# Patient Record
Sex: Male | Born: 1940 | Race: Black or African American | Hispanic: No | Smoking: Former smoker
Health system: Southern US, Community
[De-identification: ages and names within clinical notes are randomized; demographics above are authoritative.]

## PROBLEM LIST (undated history)

## (undated) DIAGNOSIS — F0390 Unspecified dementia without behavioral disturbance: Secondary | ICD-10-CM

## (undated) DIAGNOSIS — I82409 Acute embolism and thrombosis of unspecified deep veins of unspecified lower extremity: Secondary | ICD-10-CM

## (undated) DIAGNOSIS — C801 Malignant (primary) neoplasm, unspecified: Secondary | ICD-10-CM

## (undated) DIAGNOSIS — I10 Essential (primary) hypertension: Secondary | ICD-10-CM

## (undated) HISTORY — PX: NO PAST SURGERIES: SHX2092

---

## 2013-08-18 ENCOUNTER — Encounter (HOSPITAL_COMMUNITY): Payer: Self-pay | Admitting: Emergency Medicine

## 2013-08-18 ENCOUNTER — Emergency Department (HOSPITAL_COMMUNITY)
Admission: EM | Admit: 2013-08-18 | Discharge: 2013-08-18 | Disposition: A | Discharge status: Home / Self Care | Payer: Medicare HMO | Attending: Emergency Medicine | Admitting: Emergency Medicine

## 2013-08-18 DIAGNOSIS — I10 Essential (primary) hypertension: Secondary | ICD-10-CM | POA: Insufficient documentation

## 2013-08-18 DIAGNOSIS — F0390 Unspecified dementia without behavioral disturbance: Secondary | ICD-10-CM | POA: Insufficient documentation

## 2013-08-18 DIAGNOSIS — Z8546 Personal history of malignant neoplasm of prostate: Secondary | ICD-10-CM | POA: Insufficient documentation

## 2013-08-18 DIAGNOSIS — K644 Residual hemorrhoidal skin tags: Secondary | ICD-10-CM | POA: Insufficient documentation

## 2013-08-18 DIAGNOSIS — Z87891 Personal history of nicotine dependence: Secondary | ICD-10-CM | POA: Insufficient documentation

## 2013-08-18 HISTORY — DX: Unspecified dementia, unspecified severity, without behavioral disturbance, psychotic disturbance, mood disturbance, and anxiety: F03.90

## 2013-08-18 HISTORY — DX: Malignant (primary) neoplasm, unspecified: C80.1

## 2013-08-18 HISTORY — DX: Essential (primary) hypertension: I10

## 2013-08-18 LAB — CBC
HCT: 39.8 % (ref 39.0–52.0)
Hemoglobin: 13.4 g/dL (ref 13.0–17.0)
MCH: 28.9 pg (ref 26.0–34.0)
MCHC: 33.7 g/dL (ref 30.0–36.0)
MCV: 86 fL (ref 78.0–100.0)
PLATELETS: 236 10*3/uL (ref 150–400)
RBC: 4.63 MIL/uL (ref 4.22–5.81)
RDW: 13.3 % (ref 11.5–15.5)
WBC: 6.7 10*3/uL (ref 4.0–10.5)

## 2013-08-18 LAB — TYPE AND SCREEN
ABO/RH(D): B POS
Antibody Screen: NEGATIVE

## 2013-08-18 LAB — COMPREHENSIVE METABOLIC PANEL
ALK PHOS: 125 U/L — AB (ref 39–117)
ALT: 20 U/L (ref 0–53)
AST: 30 U/L (ref 0–37)
Albumin: 4 g/dL (ref 3.5–5.2)
Anion gap: 12 (ref 5–15)
BILIRUBIN TOTAL: 0.4 mg/dL (ref 0.3–1.2)
BUN: 20 mg/dL (ref 6–23)
CHLORIDE: 99 meq/L (ref 96–112)
CO2: 26 meq/L (ref 19–32)
Calcium: 12.1 mg/dL — ABNORMAL HIGH (ref 8.4–10.5)
Creatinine, Ser: 1.01 mg/dL (ref 0.50–1.35)
GFR calc Af Amer: 85 mL/min — ABNORMAL LOW (ref >90)
GFR calc non Af Amer: 73 mL/min — ABNORMAL LOW (ref >90)
Glucose, Bld: 123 mg/dL — ABNORMAL HIGH (ref 70–99)
Potassium: 3.9 mEq/L (ref 3.7–5.3)
SODIUM: 137 meq/L (ref 137–147)
Total Protein: 7.6 g/dL (ref 6.0–8.3)

## 2013-08-18 LAB — ABO/RH: ABO/RH(D): B POS

## 2013-08-18 MED ORDER — HYDROCORTISONE ACE-PRAMOXINE 1-1 % RE FOAM: 1.0000 | Freq: Two times a day (BID) | RECTAL | Status: DC

## 2013-08-18 NOTE — Progress Notes (Signed)
  CARE MANAGEMENT ED NOTE 08/18/2013  Patient:  [name] [name],[name] [name]   Account Number:  [id number]  Date Initiated:  08/18/2013  Documentation initiated by:  Livia Snellen  Subjective/Objective Assessment:   Patient presents to ED with rectal pain rectal bleeding     Subjective/Objective Assessment Detail:     Action/Plan:   Action/Plan Detail:   Anticipated DC Date:  08/18/2013     Status Recommendation to Physician:   Result of Recommendation:    Other ED [place]  Other  PCP issues    Choice offered to / List presented to:            Status of service:  Completed, signed off  ED Comments:   ED Comments Detail:  EDCM spoke to patient at bedside.  Patient confirms his pcp is [name] [name] with [facility] and [name] [name] is the patient's urologist.  System updated.

## 2013-08-18 NOTE — ED Notes (Signed)
Pts son states pt woke up this morning w/ dark red blood in underwear, states when pt wipes he is having blood on toilet paper, pt also having rectal pain.

## 2013-08-18 NOTE — ED Provider Notes (Signed)
CSN: [id number]     Arrival date & time 08/18/13  1045 History   First MD Initiated Contact with Patient 08/18/13 1128     Chief Complaint  Patient presents with  . Rectal Bleeding     (Consider location/radiation/quality/duration/timing/severity/associated sxs/prior Treatment) Patient is a 73 y.o. male presenting with hematochezia. The history is provided by the patient and a relative.  Rectal Bleeding  patient here complaining of rectal bleeding that began this morning characterized as blood in his underwear. Patient also notes rectal pain and blood on his tissue when he wipes. Denies any flank primary blood per rectum. Abdominal pain. No fever or chills. No history of GI bleeding. Symptoms only occur when he is defecating. Denies any weakness or syncope or near-syncope. Does have a history of metastatic prostate cancer and was seen by his urologist today. Denies any urinary issue.  Past Medical History  Diagnosis Date  . Cancer     prostate  . Hypertension   . Dementia    History reviewed. No pertinent past surgical history. No family history on file. History  Substance Use Topics  . Smoking status: Former Research scientist (life sciences)  . Smokeless tobacco: Never Used  . Alcohol Use: No    Review of Systems  Gastrointestinal: Positive for hematochezia.  All other systems reviewed and are negative.     Allergies  Review of patient's allergies indicates not on file.  Home Medications   Prior to Admission medications   Not on File   BP 127/72  Pulse 89  Temp(Src) 97.9 F (36.6 C) (Oral)  Resp 16  Ht 5\' 9"  (1.753 m)  Wt 154 lb (69.854 kg)  BMI 22.73 kg/m2  SpO2 98% Physical Exam  Nursing note and vitals reviewed. Constitutional: He is oriented to person, place, and time. He appears well-developed and well-nourished.  Non-toxic appearance. No distress.  HENT:  Head: Normocephalic and atraumatic.  Eyes: Conjunctivae, EOM and lids are normal. Pupils are equal, round, and reactive to  light.  Neck: Normal range of motion. Neck supple. No tracheal deviation present. No mass present.  Cardiovascular: Normal rate, regular rhythm and normal heart sounds.  Exam reveals no gallop.   No murmur heard. Pulmonary/Chest: Effort normal and breath sounds normal. No stridor. No respiratory distress. He has no decreased breath sounds. He has no wheezes. He has no rhonchi. He has no rales.  Abdominal: Soft. Normal appearance and bowel sounds are normal. He exhibits no distension. There is no tenderness. There is no rigidity, no rebound, no guarding and no CVA tenderness.  Genitourinary: Rectal exam shows external hemorrhoid. Prostate is enlarged.  No bleeding from the hemorrhoids. Yellow stool noted  Musculoskeletal: Normal range of motion. He exhibits no edema and no tenderness.  Neurological: He is alert and oriented to person, place, and time. He has normal strength. No cranial nerve deficit or sensory deficit. GCS eye subscore is 4. GCS verbal subscore is 5. GCS motor subscore is 6.  Skin: Skin is warm and dry. No abrasion and no rash noted.  Psychiatric: He has a normal mood and affect. His speech is normal and behavior is normal.    ED Course  Procedures (including critical care time) Labs Review Labs Reviewed  CBC  COMPREHENSIVE METABOLIC PANEL  POC OCCULT BLOOD, ED  TYPE AND SCREEN    Imaging Review No results found.   EKG Interpretation None      MDM   Final diagnoses:  None    Patient here with no evidence  of acute GI bleeding. Suspect that his symptoms are from his hemorrhoids. Stable for discharge    Leota Jacobsen, MD 08/18/13 1339

## 2013-08-18 NOTE — Discharge Instructions (Signed)

## 2013-08-26 ENCOUNTER — Other Ambulatory Visit: Payer: Self-pay | Admitting: Gastroenterology

## 2013-08-26 DIAGNOSIS — R109 Unspecified abdominal pain: Secondary | ICD-10-CM

## 2013-08-30 ENCOUNTER — Emergency Department (HOSPITAL_COMMUNITY): Payer: Medicare HMO

## 2013-08-30 ENCOUNTER — Inpatient Hospital Stay (HOSPITAL_COMMUNITY)
Admission: EM | Admit: 2013-08-30 | Discharge: 2013-09-02 | DRG: 640 | Disposition: A | Discharge status: Skilled Nursing Facility | Payer: Medicare HMO | Attending: Internal Medicine | Admitting: Internal Medicine

## 2013-08-30 ENCOUNTER — Encounter (HOSPITAL_COMMUNITY): Payer: Self-pay | Admitting: Emergency Medicine

## 2013-08-30 DIAGNOSIS — E872 Acidosis, unspecified: Secondary | ICD-10-CM | POA: Diagnosis present

## 2013-08-30 DIAGNOSIS — F0391 Unspecified dementia with behavioral disturbance: Secondary | ICD-10-CM

## 2013-08-30 DIAGNOSIS — C61 Malignant neoplasm of prostate: Secondary | ICD-10-CM

## 2013-08-30 DIAGNOSIS — I498 Other specified cardiac arrhythmias: Secondary | ICD-10-CM | POA: Diagnosis present

## 2013-08-30 DIAGNOSIS — R1013 Epigastric pain: Secondary | ICD-10-CM

## 2013-08-30 DIAGNOSIS — I82409 Acute embolism and thrombosis of unspecified deep veins of unspecified lower extremity: Secondary | ICD-10-CM

## 2013-08-30 DIAGNOSIS — D638 Anemia in other chronic diseases classified elsewhere: Secondary | ICD-10-CM | POA: Diagnosis present

## 2013-08-30 DIAGNOSIS — Z86711 Personal history of pulmonary embolism: Secondary | ICD-10-CM

## 2013-08-30 DIAGNOSIS — Z87891 Personal history of nicotine dependence: Secondary | ICD-10-CM

## 2013-08-30 DIAGNOSIS — Z23 Encounter for immunization: Secondary | ICD-10-CM | POA: Diagnosis not present

## 2013-08-30 DIAGNOSIS — C7951 Secondary malignant neoplasm of bone: Secondary | ICD-10-CM | POA: Diagnosis present

## 2013-08-30 DIAGNOSIS — Z7901 Long term (current) use of anticoagulants: Secondary | ICD-10-CM

## 2013-08-30 DIAGNOSIS — I10 Essential (primary) hypertension: Secondary | ICD-10-CM | POA: Diagnosis present

## 2013-08-30 DIAGNOSIS — C801 Malignant (primary) neoplasm, unspecified: Secondary | ICD-10-CM

## 2013-08-30 DIAGNOSIS — E876 Hypokalemia: Secondary | ICD-10-CM | POA: Diagnosis present

## 2013-08-30 DIAGNOSIS — F0390 Unspecified dementia without behavioral disturbance: Secondary | ICD-10-CM | POA: Diagnosis present

## 2013-08-30 DIAGNOSIS — D72829 Elevated white blood cell count, unspecified: Secondary | ICD-10-CM | POA: Diagnosis present

## 2013-08-30 DIAGNOSIS — G9340 Encephalopathy, unspecified: Secondary | ICD-10-CM

## 2013-08-30 DIAGNOSIS — K5900 Constipation, unspecified: Secondary | ICD-10-CM

## 2013-08-30 DIAGNOSIS — E860 Dehydration: Secondary | ICD-10-CM | POA: Diagnosis present

## 2013-08-30 DIAGNOSIS — Z86718 Personal history of other venous thrombosis and embolism: Secondary | ICD-10-CM | POA: Diagnosis not present

## 2013-08-30 DIAGNOSIS — E43 Unspecified severe protein-calorie malnutrition: Secondary | ICD-10-CM | POA: Diagnosis present

## 2013-08-30 DIAGNOSIS — R5383 Other fatigue: Secondary | ICD-10-CM | POA: Diagnosis not present

## 2013-08-30 DIAGNOSIS — C7952 Secondary malignant neoplasm of bone marrow: Secondary | ICD-10-CM

## 2013-08-30 DIAGNOSIS — R5381 Other malaise: Secondary | ICD-10-CM | POA: Diagnosis present

## 2013-08-30 DIAGNOSIS — Z66 Do not resuscitate: Secondary | ICD-10-CM | POA: Diagnosis present

## 2013-08-30 HISTORY — DX: Acute embolism and thrombosis of unspecified deep veins of unspecified lower extremity: I82.409

## 2013-08-30 LAB — CBC
HCT: 39 % (ref 39.0–52.0)
HEMOGLOBIN: 13.4 g/dL (ref 13.0–17.0)
MCH: 29.6 pg (ref 26.0–34.0)
MCHC: 34.4 g/dL (ref 30.0–36.0)
MCV: 86.3 fL (ref 78.0–100.0)
Platelets: 265 10*3/uL (ref 150–400)
RBC: 4.52 MIL/uL (ref 4.22–5.81)
RDW: 13.2 % (ref 11.5–15.5)
WBC: 16.7 10*3/uL — AB (ref 4.0–10.5)

## 2013-08-30 LAB — BASIC METABOLIC PANEL
Anion gap: 19 — ABNORMAL HIGH (ref 5–15)
BUN: 26 mg/dL — ABNORMAL HIGH (ref 6–23)
CHLORIDE: 95 meq/L — AB (ref 96–112)
CO2: 20 mEq/L (ref 19–32)
Calcium: >15 mg/dL (ref 8.4–10.5)
Creatinine, Ser: 1.19 mg/dL (ref 0.50–1.35)
GFR calc Af Amer: 70 mL/min — ABNORMAL LOW (ref >90)
GFR, EST NON AFRICAN AMERICAN: 60 mL/min — AB (ref >90)
GLUCOSE: 145 mg/dL — AB (ref 70–99)
Potassium: 3.9 mEq/L (ref 3.7–5.3)
SODIUM: 134 meq/L — AB (ref 137–147)

## 2013-08-30 LAB — URINALYSIS, ROUTINE W REFLEX MICROSCOPIC
Bilirubin Urine: NEGATIVE
Glucose, UA: NEGATIVE mg/dL
KETONES UR: NEGATIVE mg/dL
LEUKOCYTES UA: NEGATIVE
Nitrite: NEGATIVE
PROTEIN: NEGATIVE mg/dL
Specific Gravity, Urine: 1.024 (ref 1.005–1.030)
Urobilinogen, UA: 0.2 mg/dL (ref 0.0–1.0)
pH: 5 (ref 5.0–8.0)

## 2013-08-30 LAB — URINE MICROSCOPIC-ADD ON

## 2013-08-30 LAB — LIPASE, BLOOD: Lipase: 15 U/L (ref 11–59)

## 2013-08-30 LAB — HEPATIC FUNCTION PANEL
ALBUMIN: 3.6 g/dL (ref 3.5–5.2)
ALT: 71 U/L — ABNORMAL HIGH (ref 0–53)
AST: 53 U/L — ABNORMAL HIGH (ref 0–37)
Alkaline Phosphatase: 195 U/L — ABNORMAL HIGH (ref 39–117)
BILIRUBIN TOTAL: 0.6 mg/dL (ref 0.3–1.2)
Total Protein: 8 g/dL (ref 6.0–8.3)

## 2013-08-30 LAB — I-STAT CG4 LACTIC ACID, ED: Lactic Acid, Venous: 4.18 mmol/L — ABNORMAL HIGH (ref 0.5–2.2)

## 2013-08-30 LAB — LACTIC ACID, PLASMA: LACTIC ACID, VENOUS: 2.3 mmol/L — AB (ref 0.5–2.2)

## 2013-08-30 LAB — CBG MONITORING, ED: GLUCOSE-CAPILLARY: 132 mg/dL — AB (ref 70–99)

## 2013-08-30 MED ORDER — SODIUM CHLORIDE 0.9 % IV BOLUS (SEPSIS)
1000.0000 mL | Freq: Once | INTRAVENOUS | Status: AC
  Administered 2013-08-30: 1000 mL via INTRAVENOUS

## 2013-08-30 MED ORDER — SODIUM CHLORIDE 0.9 % IV SOLN: INTRAVENOUS | Status: AC

## 2013-08-30 MED ORDER — ONDANSETRON HCL 4 MG PO TABS: 4.0000 mg | ORAL_TABLET | Freq: Four times a day (QID) | ORAL | Status: DC | PRN

## 2013-08-30 MED ORDER — ACETAMINOPHEN 325 MG PO TABS: 650.0000 mg | ORAL_TABLET | Freq: Four times a day (QID) | ORAL | Status: DC | PRN

## 2013-08-30 MED ORDER — TAMSULOSIN HCL 0.4 MG PO CAPS
0.4000 mg | ORAL_CAPSULE | Freq: Every evening | ORAL | Status: DC
  Administered 2013-08-31 – 2013-09-02 (×4): 0.4 mg via ORAL
  Filled 2013-08-30 (×5): qty 1

## 2013-08-30 MED ORDER — CALCITONIN (SALMON) 200 UNIT/ML IJ SOLN
4.0000 [IU]/kg | Freq: Once | INTRAMUSCULAR | Status: AC
  Administered 2013-08-31: 264 [IU] via SUBCUTANEOUS
  Filled 2013-08-30: qty 1.32

## 2013-08-30 MED ORDER — SODIUM CHLORIDE 0.9 % IJ SOLN
3.0000 mL | Freq: Two times a day (BID) | INTRAMUSCULAR | Status: DC
  Administered 2013-09-01 (×2): 3 mL via INTRAVENOUS

## 2013-08-30 MED ORDER — ONDANSETRON HCL 4 MG/2ML IJ SOLN: 4.0000 mg | Freq: Four times a day (QID) | INTRAMUSCULAR | Status: DC | PRN

## 2013-08-30 MED ORDER — SODIUM CHLORIDE 0.9 % IV SOLN
INTRAVENOUS | Status: DC
  Administered 2013-08-30 – 2013-09-01 (×8): via INTRAVENOUS

## 2013-08-30 MED ORDER — ACETAMINOPHEN 650 MG RE SUPP: 650.0000 mg | Freq: Four times a day (QID) | RECTAL | Status: DC | PRN

## 2013-08-30 MED ORDER — ATORVASTATIN CALCIUM 10 MG PO TABS
10.0000 mg | ORAL_TABLET | Freq: Every day | ORAL | Status: DC
  Administered 2013-08-31 – 2013-09-02 (×3): 10 mg via ORAL
  Filled 2013-08-30 (×3): qty 1

## 2013-08-30 NOTE — ED Notes (Addendum)
Dr. Regenia Skeeter aware of critical Ca.

## 2013-08-30 NOTE — ED Notes (Signed)
Initial Contact - pt from triage to [facility]25 with family, son reports recent decline.  Pt with recently dx prostate CA, not on chemo yet, with no appetite, weakness and reported stomach pain which has been improving.  Pt denies pain at this time.  Pt appears weak and unwell.  Skin PWD.  MAEI.  +csm/+pulses.  Neuros grossly intact.  Pt denies cp/palpitations or SOB.  Abd s/nt/nd.  Pt denies n/v.  NAD.

## 2013-08-30 NOTE — ED Notes (Signed)
Pt ret from radiology,

## 2013-08-30 NOTE — ED Notes (Signed)
Gave I Stat CG4 result to MD Regenia Skeeter

## 2013-08-30 NOTE — ED Notes (Signed)
CBG is 132. °

## 2013-08-30 NOTE — ED Notes (Addendum)
Son states that the pt has metastatic prostate cancer. Pt c/o stomach pain, generalized weakness, lethargy, and decrease in activity that is increasingly getting worse each day. Son states that the stomach pain has been getting better. Son also states that pt may have some early signs of dementia and they have a neurologist appointment this week. Pt was released last Wednesday from [facility] due to rectal bleeding from hemorrhoids. Son also states that he has not had an appetite for the past two weeks. Son has been giving him Ensure.

## 2013-08-30 NOTE — ED Provider Notes (Signed)
CSN: [id number]     Arrival date & time 08/30/13  1831 History   First MD Initiated Contact with Patient 08/30/13 1858     Chief Complaint  Patient presents with  . Abdominal Pain  . Weakness     (Consider location/radiation/quality/duration/timing/severity/associated sxs/prior Treatment) HPI 73 year old male presents with weakness over the last week. The history is mostly taken from the son as the patient states he is feeling fine. Some endorses the patient has metastatic prostate cancer and is currently getting hormone therapy from his urologist, Dr. Risa Grill. The last injection was 3 weeks ago. After this he is to be getting chemotherapy the patient has been having progressive weakness and weight loss. Last week he is here for rectal bleeding that was attributed to his hemorrhoids. Not had any bleeding in his stool since then. No vomiting or diarrhea. He evidently is getting abdominal pain each day and place his epigastrium. His not currently having no symptoms now. No bloating. He's currently on calcium replacements for his "bones". Son endorse the patient is also been somewhat confused and has been having quieter/slurred speech over the last 2 days.  Past Medical History  Diagnosis Date  . Hypertension   . Dementia   . Cancer     prostate   History reviewed. No pertinent past surgical history. History reviewed. No pertinent family history. History  Substance Use Topics  . Smoking status: Former Research scientist (life sciences)  . Smokeless tobacco: Never Used  . Alcohol Use: No    Review of Systems  Constitutional: Positive for fatigue. Negative for fever.  Respiratory: Negative for shortness of breath.   Gastrointestinal: Positive for abdominal pain. Negative for vomiting, diarrhea, blood in stool and abdominal distention.  Musculoskeletal: Negative for back pain.  Neurological: Positive for speech difficulty and weakness. Negative for headaches.  Psychiatric/Behavioral: Positive for confusion.  All  other systems reviewed and are negative.     Allergies  Ace inhibitors and Namenda  Home Medications   Prior to Admission medications   Medication Sig Start Date End Date Taking? Authorizing Provider  apixaban (ELIQUIS) 5 MG TABS tablet Take 5 mg by mouth 2 (two) times daily.    Historical Provider, MD  atorvastatin (LIPITOR) 10 MG tablet Take 10 mg by mouth daily with breakfast.    Historical Provider, MD  bicalutamide (CASODEX) 50 MG tablet Take 50 mg by mouth daily with breakfast.    Historical Provider, MD  calcium gluconate 500 MG tablet Take 1 tablet by mouth daily with breakfast.    Historical Provider, MD  Cholecalciferol (VITAMIN D) 400 UNITS capsule Take 400 Units by mouth daily with breakfast.    Historical Provider, MD  ESTROGENS CONJUGATED IJ Inject as directed every 30 (thirty) days. Gets injections for prostate cancer at [facility]    Historical Provider, MD  fexofenadine (ALLEGRA) 180 MG tablet Take 180 mg by mouth daily with breakfast.    Historical Provider, MD  hydrocortisone-pramoxine (PROCTOFOAM HC) rectal foam Place 1 applicator rectally 2 (two) times daily. 08/18/13   Leota Jacobsen, MD  potassium chloride (K-DUR) 10 MEQ tablet Take 10 mEq by mouth daily with breakfast.    Historical Provider, MD  tamsulosin (FLOMAX) 0.4 MG CAPS capsule Take 0.4 mg by mouth every evening.    Historical Provider, MD   BP 122/78  Pulse 125  Temp(Src) 99.1 F (37.3 C) (Oral)  Resp 20  SpO2 97% Physical Exam  Nursing note and vitals reviewed. Constitutional: He is oriented to person, place, and  time. He appears well-developed and well-nourished.  HENT:  Head: Normocephalic and atraumatic.  Right Ear: External ear normal.  Left Ear: External ear normal.  Nose: Nose normal.  Eyes: Right eye exhibits no discharge. Left eye exhibits no discharge.  Neck: Neck supple.  Cardiovascular: Normal rate, regular rhythm, normal heart sounds and intact distal pulses.    Pulmonary/Chest: Effort normal. No respiratory distress.  Abdominal: Soft. He exhibits no distension. There is no tenderness.  Musculoskeletal: He exhibits no edema.  Neurological: He is alert and oriented to person, place, and time.  CN 2-12 grossly intact. 5/5 strength in upper extremities. 4/5 strength in lower extremities.   Skin: Skin is warm and dry.    ED Course  Procedures (including critical care time) Labs Review Labs Reviewed  CBC - Abnormal; Notable for the following:    WBC 16.7 (*)    All other components within normal limits  BASIC METABOLIC PANEL - Abnormal; Notable for the following:    Sodium 134 (*)    Chloride 95 (*)    Glucose, Bld 145 (*)    BUN 26 (*)    Calcium >15.0 (*)    GFR calc non Af Amer 60 (*)    GFR calc Af Amer 70 (*)    Anion gap 19 (*)    All other components within normal limits  URINALYSIS, ROUTINE W REFLEX MICROSCOPIC - Abnormal; Notable for the following:    Hgb urine dipstick SMALL (*)    All other components within normal limits  HEPATIC FUNCTION PANEL - Abnormal; Notable for the following:    AST 53 (*)    ALT 71 (*)    Alkaline Phosphatase 195 (*)    All other components within normal limits  CBG MONITORING, ED - Abnormal; Notable for the following:    Glucose-Capillary 132 (*)    All other components within normal limits  I-STAT CG4 LACTIC ACID, ED - Abnormal; Notable for the following:    Lactic Acid, Venous 4.18 (*)    All other components within normal limits  LIPASE, BLOOD  URINE MICROSCOPIC-ADD ON  COMPREHENSIVE METABOLIC PANEL  LACTIC ACID, PLASMA    Imaging Review Dg Chest 2 View  08/30/2013   CLINICAL DATA:  Prostate cancer.  Weakness.  EXAM: CHEST  2 VIEW  COMPARISON:  None.  FINDINGS: The lungs are clear. Heart size is normal. There is no pneumothorax or effusion. No focal bony abnormality is identified.  IMPRESSION: No acute disease.   Electronically Signed   By: Inge Rise M.D.   On: 08/30/2013 20:26    Ct Head Wo Contrast  08/30/2013   CLINICAL DATA:  Confusion, slurred speech  EXAM: CT HEAD WITHOUT CONTRAST  TECHNIQUE: Contiguous axial images were obtained from the base of the skull through the vertex without intravenous contrast.  COMPARISON:  None.  FINDINGS: No skull fracture is noted. Paranasal sinuses and mastoid air cells are unremarkable.  No intracranial hemorrhage, mass effect or midline shift. Moderate cerebral atrophy. No acute cortical infarction. No mass lesion is noted on this unenhanced scan.  IMPRESSION: No acute intracranial abnormality. Moderate cerebral atrophy. No definite acute cortical infarction.   Electronically Signed   By: Lahoma Crocker M.D.   On: 08/30/2013 20:32     EKG Interpretation None      MDM   Final diagnoses:  Hypercalcemia of malignancy  Malignant neoplasm of prostate  Unspecified constipation  Abdominal pain, epigastric  Lactic acid acidosis  Dehydration  Acute encephalopathy  Patient has no abdominal pain while in the ED. He is tachycardic has evidence of lactic acidosis. This is more likely from dehydration given his weight loss and poor by mouth intake. No evidence of anemia causing his fatigue. CT scan is benign, though does have acute confusion. There is no evidence of an infectious cause. At this time we'll continue fluids for both his lactic acidosis and hypercalcemia. Will admit to medicine.    Ephraim Hamburger, MD 08/30/13 2350

## 2013-08-30 NOTE — ED Notes (Signed)
Pt aware of need for urine specimen, sts unable to provide at this time.   

## 2013-08-30 NOTE — H&P (Signed)
[facility] History and Physical  Patient: [name] [name]  YKZ:[id number]  DOB: 1940/02/09  DOS: the patient was seen and examined on 08/30/2013 PCP: Kandice Hams, MD  Chief Complaint: Confusion and lethargy with abdominal pain  HPI: General Wearing is a 73 y.o. 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 Patient presented with confusion and lethargy. Patient is basically from [place] where he was diagnosed with DVT in 2014 and later on in November 2014 was diagnosed with metastatic prostate cancer. He was started on hormonal therapy there and then later on was brought here by the family so that they can help him managing his medication and health. Patient establish here with Dr. Risa Grill urology and started with hormonal treatment. He also establish care with the PCP. He was started on Casodex, calcium supplements, vitamin D supplements. His antihypertensive medications hydralazine and amlodipine were stopped due to persistently low blood pressure. Since last to 3 weeks he has been having complaints of epigastric pain associated with nausea and occasional vomiting. Since one of the side effect of Casodex was epigastric pain 5 days ago son stopped patient's Casodex and was awaiting urology call back. Since last 3 days the patient has been having generalized weakness, increased urination, fatigue, progressively worsening confusion. He also had a mechanical fall. Abdominal pain that the patient was initially complaining improved and later on came back. Patient was also found to have constipation.  The patient is coming from home. And at his baseline independent for most of his ADL.  Review of Systems: as mentioned in the history of present illness.  A Comprehensive review of the other systems is negative.  Past Medical History  Diagnosis Date  . Hypertension   .  Dementia   . Cancer     prostate, spine ribs, left pelvis, and skull  . DVT (deep venous thrombosis)    History reviewed. No pertinent past surgical history. Social History:  reports that he has quit smoking. He has never used smokeless tobacco. He reports that he does not drink alcohol or use illicit drugs.  Allergies  Allergen Reactions  . Ace Inhibitors Cough  . Namenda [Memantine Hcl] Other (See Comments)    Confusion     History reviewed. No pertinent family history.  Prior to Admission medications   Medication Sig Start Date End Date Taking? Authorizing Provider  apixaban (ELIQUIS) 5 MG TABS tablet Take 5 mg by mouth 2 (two) times daily.   Yes Historical Provider, MD  atorvastatin (LIPITOR) 10 MG tablet Take 10 mg by mouth daily with breakfast.   Yes Historical Provider, MD  calcium gluconate 500 MG tablet Take 1 tablet by mouth daily with breakfast.   Yes Historical Provider, MD  Cholecalciferol (VITAMIN D) 400 UNITS capsule Take 400 Units by mouth daily with breakfast.   Yes Historical Provider, MD  feeding supplement (ENSURE IMMUNE HEALTH) LIQD Take 237 mLs by mouth 3 (three) times daily with meals.   Yes Historical Provider, MD  fexofenadine (ALLEGRA) 180 MG tablet Take 180 mg by mouth daily with breakfast.   Yes Historical Provider, MD  potassium chloride (K-DUR) 10 MEQ tablet Take 10 mEq by mouth daily with breakfast.   Yes Historical Provider, MD  tamsulosin (FLOMAX) 0.4 MG CAPS capsule Take 0.4 mg by mouth every evening.   Yes Historical Provider, MD  bicalutamide (CASODEX) 50 MG tablet Take 50 mg by mouth daily.  Historical Provider, MD  ESTROGENS CONJUGATED IJ Inject as directed every 30 (thirty) days. Gets injections for prostate cancer at [facility]    Historical Provider, MD    Physical Exam: Filed Vitals:   08/30/13 2130 08/30/13 2200 08/30/13 2230 08/30/13 2258  BP: 127/77 141/84 144/92 159/85  Pulse:    108  Temp:    98.6 F (37 C)  TempSrc:    Oral   Resp: 20 31 19 20   Height:    5\' 8"  (1.727 m)  Weight:    65.772 kg (145 lb)  SpO2:    97%    General: Alert, Awake and Oriented to Time, Place and Person. Appear in mild distress Eyes: PERRL ENT: Oral Mucosa clear  dry . Neck: no JVD Cardiovascular: S1 and S2 Present, no Murmur, Peripheral Pulses Present Respiratory: Bilateral Air entry equal and Decreased, Clear to Auscultation, noCrackles, no wheezes Abdomen: Bowel Sound Present, Soft and Non tender Skin: no Rash Extremities: Bilateral trace  Pedal edema, no calf tenderness Neurologic: Grossly no focal neuro deficit.  Labs on Admission:  CBC:  Recent Labs Lab 08/30/13 1907  WBC 16.7*  HGB 13.4  HCT 39.0  MCV 86.3  PLT 265    CMP     Component Value Date/Time   NA 134* 08/30/2013 1907   K 3.9 08/30/2013 1907   CL 95* 08/30/2013 1907   CO2 20 08/30/2013 1907   GLUCOSE 145* 08/30/2013 1907   BUN 26* 08/30/2013 1907   CREATININE 1.19 08/30/2013 1907   CALCIUM >15.0* 08/30/2013 1907   PROT 8.0 08/30/2013 1907   ALBUMIN 3.6 08/30/2013 1907   AST 53* 08/30/2013 1907   ALT 71* 08/30/2013 1907   ALKPHOS 195* 08/30/2013 1907   BILITOT 0.6 08/30/2013 1907   GFRNONAA 60* 08/30/2013 1907   GFRAA 70* 08/30/2013 1907     Recent Labs Lab 08/30/13 1907  LIPASE 15   No results found for this basename: AMMONIA,  in the last 168 hours  No results found for this basename: CKTOTAL, CKMB, CKMBINDEX, TROPONINI,  in the last 168 hours BNP (last 3 results) No results found for this basename: PROBNP,  in the last 8760 hours  Radiological Exams on Admission: Dg Chest 2 View  08/30/2013   CLINICAL DATA:  Prostate cancer.  Weakness.  EXAM: CHEST  2 VIEW  COMPARISON:  None.  FINDINGS: The lungs are clear. Heart size is normal. There is no pneumothorax or effusion. No focal bony abnormality is identified.  IMPRESSION: No acute disease.   Electronically Signed   By: Inge Rise M.D.   On: 08/30/2013 20:26   Ct Head Wo  Contrast  08/30/2013   CLINICAL DATA:  Confusion, slurred speech  EXAM: CT HEAD WITHOUT CONTRAST  TECHNIQUE: Contiguous axial images were obtained from the base of the skull through the vertex without intravenous contrast.  COMPARISON:  None.  FINDINGS: No skull fracture is noted. Paranasal sinuses and mastoid air cells are unremarkable.  No intracranial hemorrhage, mass effect or midline shift. Moderate cerebral atrophy. No acute cortical infarction. No mass lesion is noted on this unenhanced scan.  IMPRESSION: No acute intracranial abnormality. Moderate cerebral atrophy. No definite acute cortical infarction.   Electronically Signed   By: Lahoma Crocker M.D.   On: 08/30/2013 20:32    EKG: Independently reviewed. nonspecific ST and T waves changes, sinus tachycardia. Assessment/Plan Principal Problem:   Hypercalcemia of malignancy Active Problems:   Hypertension   Dementia   Cancer  DVT (deep venous thrombosis)   1. Hypercalcemia of malignancy  patient is presenting with complaints of confusion, constipation, epigastric pain, appears dehydrated on exam, increasing urination and generalized weakness. He is found to have calcium level of more than 15. He has history of prostatic cancer with bony metastasis, he was also on Casodex, he was also taking calcium supplements and vitamin D as his potential etiology for his hypercalcemia his combination of his malignancy as well as oral intake. Currently his energy level has been trending downward with IV fluids only. I will give him one dose of calcitonin and monitor his suppression again in the morning. Continue with aggressive IV hydration at a rate of 150 cc per hour. Her ins and outs. Daily weight. The patient is on responding to calcitonin that he may require calcitonin as well as bisphosphonates.  2.hypertension. At present his blood pressure is stable continue to monitor.  3. DVT. Continue Apixaban. CT of the head is negative for any acute  hemorrhage.  4. prostatic cancer. At present holding his treatment. Continue monitoring.   DVT Prophylaxis: on chronic anticoagulation Nutrition:  regular diet   Code Status:  DNR/DNI   Family Communication:  son was present at bedside, opportunity was given to ask question and all questions were answered satisfactorily at the time of interview. Disposition: Admitted to inpatient in med-surge unit.  Author: Berle Mull, MD [facility] Pager: [phone] 08/30/2013, 11:20 PM    If 7PM-7AM, please contact night-coverage [email or web address] Password [email or web address]  **Disclaimer: This note may have been dictated with voice recognition software. Similar sounding words can inadvertently be transcribed and this note may contain transcription errors which may not have been corrected upon publication of note.**

## 2013-08-31 ENCOUNTER — Telehealth: Payer: Self-pay | Admitting: Neurology

## 2013-08-31 ENCOUNTER — Ambulatory Visit: Payer: Medicare HMO | Admitting: Neurology

## 2013-08-31 DIAGNOSIS — F0390 Unspecified dementia without behavioral disturbance: Secondary | ICD-10-CM | POA: Diagnosis present

## 2013-08-31 DIAGNOSIS — R1013 Epigastric pain: Secondary | ICD-10-CM

## 2013-08-31 DIAGNOSIS — E860 Dehydration: Secondary | ICD-10-CM

## 2013-08-31 DIAGNOSIS — G9340 Encephalopathy, unspecified: Secondary | ICD-10-CM

## 2013-08-31 DIAGNOSIS — I10 Essential (primary) hypertension: Secondary | ICD-10-CM | POA: Diagnosis present

## 2013-08-31 DIAGNOSIS — I82409 Acute embolism and thrombosis of unspecified deep veins of unspecified lower extremity: Secondary | ICD-10-CM | POA: Diagnosis present

## 2013-08-31 DIAGNOSIS — C801 Malignant (primary) neoplasm, unspecified: Secondary | ICD-10-CM | POA: Diagnosis present

## 2013-08-31 LAB — COMPREHENSIVE METABOLIC PANEL
ALBUMIN: 3.1 g/dL — AB (ref 3.5–5.2)
ALK PHOS: 167 U/L — AB (ref 39–117)
ALT: 56 U/L — ABNORMAL HIGH (ref 0–53)
ALT: 58 U/L — ABNORMAL HIGH (ref 0–53)
ANION GAP: 14 (ref 5–15)
AST: 44 U/L — ABNORMAL HIGH (ref 0–37)
AST: 45 U/L — ABNORMAL HIGH (ref 0–37)
Albumin: 3.2 g/dL — ABNORMAL LOW (ref 3.5–5.2)
Alkaline Phosphatase: 165 U/L — ABNORMAL HIGH (ref 39–117)
Anion gap: 16 — ABNORMAL HIGH (ref 5–15)
BILIRUBIN TOTAL: 0.7 mg/dL (ref 0.3–1.2)
BUN: 23 mg/dL (ref 6–23)
BUN: 19 mg/dL (ref 6–23)
CALCIUM: 14 mg/dL — AB (ref 8.4–10.5)
CO2: 22 mEq/L (ref 19–32)
CO2: 22 meq/L (ref 19–32)
Calcium: 12.8 mg/dL — ABNORMAL HIGH (ref 8.4–10.5)
Chloride: 101 mEq/L (ref 96–112)
Chloride: 100 mEq/L (ref 96–112)
Creatinine, Ser: 1.05 mg/dL (ref 0.50–1.35)
Creatinine, Ser: 0.96 mg/dL (ref 0.50–1.35)
GFR calc Af Amer: 81 mL/min — ABNORMAL LOW (ref >90)
GFR calc Af Amer: >90 mL/min (ref >90)
GFR calc non Af Amer: 70 mL/min — ABNORMAL LOW (ref >90)
GFR calc non Af Amer: 82 mL/min — ABNORMAL LOW (ref >90)
GLUCOSE: 120 mg/dL — AB (ref 70–99)
Glucose, Bld: 118 mg/dL — ABNORMAL HIGH (ref 70–99)
POTASSIUM: 3.6 meq/L — AB (ref 3.7–5.3)
Potassium: 3.7 mEq/L (ref 3.7–5.3)
Sodium: 137 mEq/L (ref 137–147)
Sodium: 138 mEq/L (ref 137–147)
TOTAL PROTEIN: 6.8 g/dL (ref 6.0–8.3)
Total Bilirubin: 0.7 mg/dL (ref 0.3–1.2)
Total Protein: 6.8 g/dL (ref 6.0–8.3)

## 2013-08-31 LAB — CBC WITH DIFFERENTIAL/PLATELET
Basophils Absolute: 0 10*3/uL (ref 0.0–0.1)
Basophils Relative: 0 % (ref 0–1)
Eosinophils Absolute: 0 10*3/uL (ref 0.0–0.7)
Eosinophils Relative: 0 % (ref 0–5)
HCT: 34.1 % — ABNORMAL LOW (ref 39.0–52.0)
HEMOGLOBIN: 11.4 g/dL — AB (ref 13.0–17.0)
LYMPHS ABS: 1.2 10*3/uL (ref 0.7–4.0)
LYMPHS PCT: 9 % — AB (ref 12–46)
MCH: 28.9 pg (ref 26.0–34.0)
MCHC: 33.4 g/dL (ref 30.0–36.0)
MCV: 86.3 fL (ref 78.0–100.0)
MONOS PCT: 14 % — AB (ref 3–12)
Monocytes Absolute: 2.1 10*3/uL — ABNORMAL HIGH (ref 0.1–1.0)
NEUTROS ABS: 11.1 10*3/uL — AB (ref 1.7–7.7)
NEUTROS PCT: 77 % (ref 43–77)
Platelets: 210 10*3/uL (ref 150–400)
RBC: 3.95 MIL/uL — AB (ref 4.22–5.81)
RDW: 13.1 % (ref 11.5–15.5)
WBC: 14.5 10*3/uL — AB (ref 4.0–10.5)

## 2013-08-31 MED ORDER — HALOPERIDOL LACTATE 5 MG/ML IJ SOLN
1.0000 mg | Freq: Four times a day (QID) | INTRAMUSCULAR | Status: DC | PRN
  Administered 2013-08-31: 1 mg via INTRAVENOUS
  Filled 2013-08-31: qty 1

## 2013-08-31 MED ORDER — PNEUMOCOCCAL VAC POLYVALENT 25 MCG/0.5ML IJ INJ
0.5000 mL | INJECTION | INTRAMUSCULAR | Status: AC
  Administered 2013-09-01: 0.5 mL via INTRAMUSCULAR
  Filled 2013-08-31 (×2): qty 0.5

## 2013-08-31 MED ORDER — ENSURE COMPLETE PO LIQD
237.0000 mL | ORAL | Status: DC
  Administered 2013-08-31 – 2013-09-02 (×3): 237 mL via ORAL

## 2013-08-31 MED ORDER — ALPRAZOLAM 0.25 MG PO TABS
0.2500 mg | ORAL_TABLET | Freq: Two times a day (BID) | ORAL | Status: DC | PRN
  Administered 2013-08-31: 0.25 mg via ORAL
  Filled 2013-08-31: qty 1

## 2013-08-31 MED ORDER — APIXABAN 5 MG PO TABS
5.0000 mg | ORAL_TABLET | Freq: Two times a day (BID) | ORAL | Status: DC
Start: 2013-08-31 — End: 2013-09-02
  Administered 2013-08-31 – 2013-09-02 (×5): 5 mg via ORAL
  Filled 2013-08-31 (×7): qty 1

## 2013-08-31 NOTE — Progress Notes (Signed)
ANTICOAGULATION CONSULT NOTE - Initial Consult  Pharmacy Consult for Apixaban Indication: DVT  Allergies  Allergen Reactions  . Ace Inhibitors Cough  . Namenda [Memantine Hcl] Other (See Comments)    Confusion     Patient Measurements: Height: 5\' 8"  (172.7 cm) Weight: 145 lb (65.772 kg) IBW/kg (Calculated) : 68.4 Heparin Dosing Weight:   Vital Signs: Temp: 98.6 F (37 C) (07/27 2258) Temp src: Oral (07/27 2258) BP: 159/85 mmHg (07/27 2258) Pulse Rate: 108 (07/27 2258)  Labs:  Recent Labs  08/30/13 1907 08/30/13 2325  HGB 13.4  --   HCT 39.0  --   PLT 265  --   CREATININE 1.19 1.05    Estimated Creatinine Clearance: 60.9 ml/min (by C-G formula based on Cr of 1.05).   Medical History: Past Medical History  Diagnosis Date  . Hypertension   . Dementia   . Cancer     prostate, spine ribs, left pelvis, and skull  . DVT (deep venous thrombosis)     Medications:  Prescriptions prior to admission  Medication Sig Dispense Refill  . apixaban (ELIQUIS) 5 MG TABS tablet Take 5 mg by mouth 2 (two) times daily.      Marland Kitchen atorvastatin (LIPITOR) 10 MG tablet Take 10 mg by mouth daily with breakfast.      . calcium gluconate 500 MG tablet Take 1 tablet by mouth daily with breakfast.      . Cholecalciferol (VITAMIN D) 400 UNITS capsule Take 400 Units by mouth daily with breakfast.      . feeding supplement (ENSURE IMMUNE HEALTH) LIQD Take 237 mLs by mouth 3 (three) times daily with meals.      . fexofenadine (ALLEGRA) 180 MG tablet Take 180 mg by mouth daily with breakfast.      . potassium chloride (K-DUR) 10 MEQ tablet Take 10 mEq by mouth daily with breakfast.      . tamsulosin (FLOMAX) 0.4 MG CAPS capsule Take 0.4 mg by mouth every evening.      . bicalutamide (CASODEX) 50 MG tablet Take 50 mg by mouth daily.      Marland Kitchen ESTROGENS CONJUGATED IJ Inject as directed every 30 (thirty) days. Gets injections for prostate cancer at [facility]       Scheduled:  . sodium  chloride   Intravenous STAT  . apixaban  5 mg Oral BID  . atorvastatin  10 mg Oral q1800  . sodium chloride  3 mL Intravenous Q12H  . tamsulosin  0.4 mg Oral QPM    Assessment: Patient with apixaban ordred prior to admission. MD wishes pharmacy to continue medication.  Goal of Therapy:  Apixaban based on manufacturer dosing recommendations.     Plan:  Continue home apixaban dosing.  Tyler Deis, Shea Stakes [place] 08/31/2013,3:42 AM

## 2013-08-31 NOTE — Progress Notes (Signed)
CRITICAL VALUE ALERT  Critical value received:  Calcium- 14.0  Date of notification:  08/31/13  Time of notification:  0008  Critical value read back:Yes.    Nurse who received alert:  Sabino Gasser, RN  MD notified (1st page):  Dr. Posey Pronto  Time of first page:  0032  MD notified (2nd page):  Time of second page:  Responding MD:  Dr. Posey Pronto  Time MD responded:  [phone]

## 2013-08-31 NOTE — Progress Notes (Signed)
INITIAL NUTRITION ASSESSMENT  DOCUMENTATION CODES Per approved criteria  -Severe malnutrition in the context of chronic illness  Pt meets criteria for severe MALNUTRITION in the context of chronic illness as evidenced by 6% weight loss in < one month, PO intake <75% for one month.   INTERVENTION:  -Recommend Ensure Complete once daily -Provided pt's family with nutrition supplement coupons -Consider liberalizing diet if PO intake decreases -Will continue to monitor  NUTRITION DIAGNOSIS: Inadequate oral intake related to constipation/abd pain as evidenced by PO intake <75%, 10 lbs weight loss.   Goal: Pt to meet >/= 90% of their estimated nutrition needs    Monitor:  Total protein/energy intake, labs, weights  Reason for Assessment: MST  73 y.o. male  Admitting Dx: Hypercalcemia of malignancy  ASSESSMENT: 73 y.o. 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 Son attributed this to medications, abd pain, and constipation -Pt was eating less than one meal/day, family encouraging pt to consume one Ensure daily -Endorsed an unintentional wt loss of 10 lbs in 2-3 weeks (6.5% body weight loss, severe for time frame) -Constipation improving, pt with regular BMs during admit -Abd pain/appetite also improving, pt consuming 50-75% of meals.  -Consider diet liberalization to regular d/t hx of weight loss, prostate cancer and HTN is stable -Will order Ensure Complete once daily. Provided pt's son with nutrition supplement coupons and encouraged pt to comply with supplement for nutrient replenishment upon d/c  Height: Ht Readings from Last 1 Encounters:  08/30/13 5\' 8"  (1.727 m)    Weight: Wt Readings from Last 1 Encounters:  08/31/13  145 lb 14.4 oz (66.18 kg)    Ideal Body Weight: 154 lbs  % Ideal Body Weight: 94%  Wt Readings from Last 10 Encounters:  08/31/13 145 lb 14.4 oz (66.18 kg)  08/18/13 154 lb (69.854 kg)    Usual Body Weight: 155 lbs  % Usual Body Weight: 96%  BMI:  Body mass index is 22.19 kg/(m^2).  Estimated Nutritional Needs: Kcal: 2000-2200 Protein: 85-100 gram Fluid: >/=2000 ml/daily  Skin: WDL  Diet Order: Sodium Restricted  EDUCATION NEEDS: -Education needs addressed   Intake/Output Summary (Last 24 hours) at 08/31/13 1506 Last data filed at 08/31/13 1414  Gross per 24 hour  Intake 2312.5 ml  Output    100 ml  Net 2212.5 ml    Last BM: 7/27   Labs:   Recent Labs Lab 08/30/13 1907 08/30/13 2325 08/31/13 0447  NA 134* 137 138  K 3.9 3.6* 3.7  CL 95* 101 100  CO2 20 22 22   BUN 26* 23 19  CREATININE 1.19 1.05 0.96  CALCIUM >15.0* 14.0* 12.8*  GLUCOSE 145* 118* 120*    CBG (last 3)   Recent Labs  08/30/13 1936  GLUCAP 132*    Scheduled Meds: . sodium chloride   Intravenous STAT  . apixaban  5 mg Oral BID  . atorvastatin  10 mg Oral q1800  . feeding supplement (ENSURE COMPLETE)  237 mL Oral Q24H  . sodium chloride  3 mL Intravenous Q12H  . tamsulosin  0.4 mg Oral QPM    Continuous Infusions: . sodium chloride 150 mL/hr at 08/31/13 1412    Past Medical History  Diagnosis Date  . Hypertension   . Dementia   .  Cancer     prostate, spine ribs, left pelvis, and skull  . DVT (deep venous thrombosis)     History reviewed. No pertinent past surgical history.  Atlee Abide MS RD LDN Clinical Dietitian UXLKG:[phone]

## 2013-08-31 NOTE — Care Management Note (Signed)
    Page 1 of 1   08/31/2013     2:03:25 PM CARE MANAGEMENT NOTE 08/31/2013  Patient:  [name] [name],[name] [name]   Account Number:  [id number]  Date Initiated:  08/31/2013  Documentation initiated by:  Dessa Phi  Subjective/Objective Assessment:   73 Y/O M ADMITTED W/HYPERCALCEMIAOF MALIGNANCY.HX:DNR.DEMENTIA,MET PROSTATE CA W/METS SPINE.     Action/Plan:   FROM HOME W/FAMILY.   Anticipated DC Date:  09/03/2013   Anticipated DC Plan:  [place]         Choice offered to / List presented to:             Status of service:   Medicare Important Message given?   (If response is "NO", the following Medicare IM given date fields will be blank) Date Medicare IM given:   Medicare IM given by:   Date Additional Medicare IM given:   Additional Medicare IM given by:    Discharge Disposition:    Per UR Regulation:  Reviewed for med. necessity/level of care/duration of stay  If discussed at [place] of Stay Meetings, dates discussed:    Comments:  08/31/13 Gissell Barra RN,BSN NCM [phone] AWAIT PT CONS,RECOMMENDATIONS.

## 2013-08-31 NOTE — Telephone Encounter (Signed)
This patient canceled appointment within several hours of the scheduled date.

## 2013-08-31 NOTE — Progress Notes (Signed)
[facility] PROGRESS NOTE  [name] [name] [id number]:[id number] DOB: 1941/01/24 DOA: 08/30/2013 PCP: Kandice Hams, MD  Assessment/Plan: 73 y.o. 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 Hypercalcemia of malignancy with confusion, constipation, epigastric pain'  Ca > 15 on admit  -Ca likely worse with taking extra Ca,+Vit D;  -Ca improved on IVF; s/p calcitonin;  -cont IVF, f/u labs, when well hydrated may need bisphosphonate OP; d/c vit D, oral  CA   2. Hypertension. At present his blood pressure is stable continue to monitor.  3. H/o DVT. continue Apixaban.  4. Prostatic cancer. Cont OP follow up  5. Lethargy, generalized weakness, likely due to hyper Ca, dehydration -Improved on IVF, neuro exam no focal; CT head: no acute findings; obtain PT eval  6. Leukocytosis no s/s of infection; afebrile; UA unremarkable; WBC improved; monitor     Code Status: DNR Family Communication: d/w patient, his son  (indicate person spoken with, relationship, and if by phone, the number) Disposition Plan: pend PT eval   Consultants:  none  Procedures:  non  Antibiotics:  non (indicate start date, and stop date if known)  HPI/Subjective: alert  Objective: Filed Vitals:   08/31/13 0405  BP: 140/89  Pulse: 106  Temp: 98.6 F (37 C)  Resp: 20    Intake/Output Summary (Last 24 hours) at 08/31/13 1338 Last data filed at 08/31/13 1134  Gross per 24 hour  Intake 2072.5 ml  Output    100 ml  Net 1972.5 ml   Filed Weights   08/30/13 2258 08/31/13 0405  Weight: 65.772 kg (145 lb) 66.18 kg (145 lb 14.4 oz)    Exam:   General:  alert  Cardiovascular: s1,s2 rrr  Respiratory: CTA BL  Abdomen: soft, nt,nd   Musculoskeletal: no LE edema   Data Reviewed: Basic Metabolic  Panel:  Recent Labs Lab 08/30/13 1907 08/30/13 2325 08/31/13 0447  NA 134* 137 138  K 3.9 3.6* 3.7  CL 95* 101 100  CO2 20 22 22   GLUCOSE 145* 118* 120*  BUN 26* 23 19  CREATININE 1.19 1.05 0.96  CALCIUM >15.0* 14.0* 12.8*   Liver Function Tests:  Recent Labs Lab 08/30/13 1907 08/30/13 2325 08/31/13 0447  AST 53* 44* 45*  ALT 71* 56* 58*  ALKPHOS 195* 165* 167*  BILITOT 0.6 0.7 0.7  PROT 8.0 6.8 6.8  ALBUMIN 3.6 3.1* 3.2*    Recent Labs Lab 08/30/13 1907  LIPASE 15   No results found for this basename: AMMONIA,  in the last 168 hours CBC:  Recent Labs Lab 08/30/13 1907 08/31/13 0447  WBC 16.7* 14.5*  NEUTROABS  --  11.1*  HGB 13.4 11.4*  HCT 39.0 34.1*  MCV 86.3 86.3  PLT 265 210   Cardiac Enzymes: No results found for this basename: CKTOTAL, CKMB, CKMBINDEX, TROPONINI,  in the last 168 hours BNP (last 3 results) No results found for this basename: PROBNP,  in the last 8760 hours CBG:  Recent Labs Lab 08/30/13 1936  GLUCAP 132*    No results found for this or any previous visit (from the past 240 hour(s)).   Studies: Dg Chest 2 View  08/30/2013   CLINICAL DATA:  Prostate cancer.  Weakness.  EXAM: CHEST  2 VIEW  COMPARISON:  None.  FINDINGS: The lungs are clear. Heart size is normal.  There is no pneumothorax or effusion. No focal bony abnormality is identified.  IMPRESSION: No acute disease.   Electronically Signed   By: Inge Rise M.D.   On: 08/30/2013 20:26   Ct Head Wo Contrast  08/30/2013   CLINICAL DATA:  Confusion, slurred speech  EXAM: CT HEAD WITHOUT CONTRAST  TECHNIQUE: Contiguous axial images were obtained from the base of the skull through the vertex without intravenous contrast.  COMPARISON:  None.  FINDINGS: No skull fracture is noted. Paranasal sinuses and mastoid air cells are unremarkable.  No intracranial hemorrhage, mass effect or midline shift. Moderate cerebral atrophy. No acute cortical infarction. No mass lesion is noted  on this unenhanced scan.  IMPRESSION: No acute intracranial abnormality. Moderate cerebral atrophy. No definite acute cortical infarction.   Electronically Signed   By: Lahoma Crocker M.D.   On: 08/30/2013 20:32    Scheduled Meds: . sodium chloride   Intravenous STAT  . apixaban  5 mg Oral BID  . atorvastatin  10 mg Oral q1800  . feeding supplement (ENSURE COMPLETE)  237 mL Oral Q24H  . sodium chloride  3 mL Intravenous Q12H  . tamsulosin  0.4 mg Oral QPM   Continuous Infusions: . sodium chloride 150 mL/hr at 08/31/13 0923    Principal Problem:   Hypercalcemia of malignancy Active Problems:   Hypertension   Dementia   Cancer   DVT (deep venous thrombosis)    Time spent: >35 minutes     Kinnie Feil  [facility] Pager [phone]. If 7PM-7AM, please contact night-coverage at [email or web address], password [facility] 08/31/2013, 1:38 PM  LOS: 1 day

## 2013-09-01 ENCOUNTER — Other Ambulatory Visit: Payer: Medicare HMO

## 2013-09-01 DIAGNOSIS — I82409 Acute embolism and thrombosis of unspecified deep veins of unspecified lower extremity: Secondary | ICD-10-CM

## 2013-09-01 DIAGNOSIS — E43 Unspecified severe protein-calorie malnutrition: Secondary | ICD-10-CM | POA: Insufficient documentation

## 2013-09-01 DIAGNOSIS — F0390 Unspecified dementia without behavioral disturbance: Secondary | ICD-10-CM

## 2013-09-01 LAB — BASIC METABOLIC PANEL
Anion gap: 13 (ref 5–15)
BUN: 11 mg/dL (ref 6–23)
CO2: 21 mEq/L (ref 19–32)
Calcium: 11.1 mg/dL — ABNORMAL HIGH (ref 8.4–10.5)
Chloride: 100 mEq/L (ref 96–112)
Creatinine, Ser: 0.76 mg/dL (ref 0.50–1.35)
GFR calc Af Amer: >90 mL/min (ref >90)
Glucose, Bld: 103 mg/dL — ABNORMAL HIGH (ref 70–99)
Potassium: 3.3 mEq/L — ABNORMAL LOW (ref 3.7–5.3)
SODIUM: 134 meq/L — AB (ref 137–147)

## 2013-09-01 LAB — CBC
HCT: 31.6 % — ABNORMAL LOW (ref 39.0–52.0)
Hemoglobin: 10.8 g/dL — ABNORMAL LOW (ref 13.0–17.0)
MCH: 29.1 pg (ref 26.0–34.0)
MCHC: 34.2 g/dL (ref 30.0–36.0)
MCV: 85.2 fL (ref 78.0–100.0)
PLATELETS: 204 10*3/uL (ref 150–400)
RBC: 3.71 MIL/uL — ABNORMAL LOW (ref 4.22–5.81)
RDW: 13 % (ref 11.5–15.5)
WBC: 11.4 10*3/uL — ABNORMAL HIGH (ref 4.0–10.5)

## 2013-09-01 LAB — PHOSPHORUS: Phosphorus: 0.7 mg/dL — CL (ref 2.3–4.6)

## 2013-09-01 LAB — TSH: TSH: 1.69 u[IU]/mL (ref 0.350–4.500)

## 2013-09-01 MED ORDER — SODIUM CHLORIDE 0.9 % IV SOLN
60.0000 mg | Freq: Once | INTRAVENOUS | Status: AC
  Administered 2013-09-01: 60 mg via INTRAVENOUS
  Filled 2013-09-01: qty 6.67
  Filled 2013-09-01: qty 20

## 2013-09-01 MED ORDER — POTASSIUM CHLORIDE CRYS ER 20 MEQ PO TBCR
40.0000 meq | EXTENDED_RELEASE_TABLET | Freq: Once | ORAL | Status: AC
  Administered 2013-09-01: 40 meq via ORAL
  Filled 2013-09-01: qty 2

## 2013-09-01 MED ORDER — POTASSIUM PHOSPHATES 15 MMOLE/5ML IV SOLN
20.0000 mmol | Freq: Once | INTRAVENOUS | Status: AC
  Administered 2013-09-01: 20 mmol via INTRAVENOUS
  Filled 2013-09-01: qty 6.67

## 2013-09-01 NOTE — Progress Notes (Signed)
Clinical Social Work Department BRIEF PSYCHOSOCIAL ASSESSMENT 09/01/2013  Patient:  [name],[name]     Account Number:  [id number]     Admit date:  08/30/2013  Clinical Social Worker:  Renold Genta  Date/Time:  09/01/2013 01:58 PM  Referred by:  Physician  Date Referred:  09/01/2013 Referred for  SNF Placement   Other Referral:   Interview type:  Patient Other interview type:   and son, [name] [name] at bedside    PSYCHOSOCIAL DATA Living Status:  [place] Admitted from facility:   Level of care:   Primary support name:  [name] [name], [name] [name] (son) c#: [phone] h#: [phone] Primary support relationship to patient:  CHILD, ADULT Degree of support available:   good    CURRENT CONCERNS Current Concerns  Post-Acute Placement   Other Concerns:    SOCIAL WORK ASSESSMENT / PLAN CSW received consult that PT recommended SNF for patient at discharge.   Assessment/plan status:  Information/Referral to [employer] Other assessment/ plan:   Information/referral to community resources:   CSW completed FL2 and faxed information out to [facility].    PATIENT'S/FAMILY'S RESPONSE TO PLAN OF CARE: CSW provided bed offers, son accepted bed at [facility] stating that the SNF is located around the corner from their house. Son informed CSW that patient lives with him along with his mother-in-law & father-in-law. CSW contacted Ria Comment @ [employer] that patient plans to go to [facility] at discharge.       [name], [facility] Clinical Social Worker cell #: [phone]

## 2013-09-01 NOTE — Progress Notes (Signed)
[facility] PROGRESS NOTE  [name] [name] PFX:[id number] DOB: Aug 21, 1940 DOA: 08/30/2013 PCP: Kandice Hams, MD  Assessment/Plan  73 y.o. 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 Hypercalcemia of malignancy with confusion, constipation, epigastric pain' Ca > 15 on admit.  -  Check phos, PTH, PTH-RP, vitamin D levels and PSA -  Agree with stopping Ca,+Vit D;  -  Bisphosphonate started today -  Continue IV fluids -  Discussed poor prognosis with family if this is hypercalcemia of malignancy -  Will recommend a palliative care follow patient at skilled nursing facility to help transition to hospice care should the patient, his son, and his primary urologist decide to stop treatment  Sinus tachycardia, may be secondary to pulmonary embolism given his underlying malignancy and history of blood clots in the past, however, the patient has been compliant with his apixaban and we are not planning to stop this medication.  Identifying acute PE would not change treatment -  Defer further evaluation for pulmonary embolism -  Continue IV fluids -  Check TSH  2. Hypertension. At present his blood pressure is stable continue to monitor.  3. H/o DVT. continue Apixaban.  4. Prostatic cancer. Cont OP follow up.  PSA 5. Lethargy, generalized weakness, likely due to hyper Ca, dehydration  - Improving  - MRI brain to evaluate for possible stroke or brain metastases -  CT head: no acute findings -  Plan to transfer to skilled nursing facility tomorrow pending results of MRI and if calcium continuing to trend down 6. Leukocytosis no s/s of infection; afebrile; UA unremarkable, chest x-ray negative - May have been reactive from dehydration and hypercalcemia -  Continue IV fluids  Severe protein calorie  malnutrition -  Nutrition consultation appreciated -  Regular diet with supplements  Hypokalemia due to malnutrition -  Oral KCl  Normocytic anemia, likely secondary to chronic disease or malignancy =- Repeat CBC in a.m.  Diet:  Regular  Access:  PIV  IVF:  Yes  Proph:  apixaban  Code Status: DO NOT RESUSCITATE  Family Communication: Patient and his son  Disposition Plan: To skilled nursing facility probably tomorrow    Consultants:  None   Procedures:  Chest x-ray  CT head  MRI brain   Antibiotics:  None    HPI/Subjective:  Confusion and speech improving. Still has moments of slurred speech and confusion worse than baseline however. Abdominal pain improving and having bowel movements.   Objective: Filed Vitals:   08/31/13 0405 08/31/13 1414 08/31/13 2112 09/01/13 0500  BP: 140/89 117/79 126/80 124/72  Pulse: 106 116 113 119  Temp: 98.6 F (37 C) 98.1 F (36.7 C) 99 F (37.2 C) 99.6 F (37.6 C)  TempSrc: Oral Oral Oral Oral  Resp: 20 20 20 20   Height:      Weight: 66.18 kg (145 lb 14.4 oz)   67 kg (147 lb 11.3 oz)  SpO2: 95% 99% 97% 95%    Intake/Output Summary (Last 24 hours) at 09/01/13 1504 Last data filed at 09/01/13 0700  Gross per 24 hour  Intake   2640 ml  Output    625 ml  Net   2015 ml   Filed Weights   08/30/13 2258 08/31/13 0405 09/01/13 0500  Weight: 65.772 kg (145 lb) 66.18 kg (145 lb 14.4 oz) 67 kg (  147 lb 11.3 oz)    Exam:   General:  Thin BM, No acute distress  HEENT:  NCAT, MMM  Cardiovascular:   Tachycardic, RR, nl S1, S2 no mrg, 2+ pulses, warm extremities  Respiratory:  CTAB, no increased WOB  Abdomen:   NABS, soft, NT/ND  MSK:   Normal tone and bulk, no LEE  Neuro:   diffusely weak, no focal neurologic deficits   Data Reviewed: Basic Metabolic Panel:  Recent Labs Lab 08/30/13 1907 08/30/13 2325 08/31/13 0447 09/01/13 0508  NA 134* 137 138 134*  K 3.9 3.6* 3.7 3.3*  CL 95* 101 100 100  CO2 20 22 22  21   GLUCOSE 145* 118* 120* 103*  BUN 26* 23 19 11   CREATININE 1.19 1.05 0.96 0.76  CALCIUM >15.0* 14.0* 12.8* 11.1*   Liver Function Tests:  Recent Labs Lab 08/30/13 1907 08/30/13 2325 08/31/13 0447  AST 53* 44* 45*  ALT 71* 56* 58*  ALKPHOS 195* 165* 167*  BILITOT 0.6 0.7 0.7  PROT 8.0 6.8 6.8  ALBUMIN 3.6 3.1* 3.2*    Recent Labs Lab 08/30/13 1907  LIPASE 15   No results found for this basename: AMMONIA,  in the last 168 hours CBC:  Recent Labs Lab 08/30/13 1907 08/31/13 0447 09/01/13 0508  WBC 16.7* 14.5* 11.4*  NEUTROABS  --  11.1*  --   HGB 13.4 11.4* 10.8*  HCT 39.0 34.1* 31.6*  MCV 86.3 86.3 85.2  PLT 265 210 204   Cardiac Enzymes: No results found for this basename: CKTOTAL, CKMB, CKMBINDEX, TROPONINI,  in the last 168 hours BNP (last 3 results) No results found for this basename: PROBNP,  in the last 8760 hours CBG:  Recent Labs Lab 08/30/13 1936  GLUCAP 132*    No results found for this or any previous visit (from the past 240 hour(s)).   Studies: Dg Chest 2 View  08/30/2013   CLINICAL DATA:  Prostate cancer.  Weakness.  EXAM: CHEST  2 VIEW  COMPARISON:  None.  FINDINGS: The lungs are clear. Heart size is normal. There is no pneumothorax or effusion. No focal bony abnormality is identified.  IMPRESSION: No acute disease.   Electronically Signed   By: Inge Rise M.D.   On: 08/30/2013 20:26   Ct Head Wo Contrast  08/30/2013   CLINICAL DATA:  Confusion, slurred speech  EXAM: CT HEAD WITHOUT CONTRAST  TECHNIQUE: Contiguous axial images were obtained from the base of the skull through the vertex without intravenous contrast.  COMPARISON:  None.  FINDINGS: No skull fracture is noted. Paranasal sinuses and mastoid air cells are unremarkable.  No intracranial hemorrhage, mass effect or midline shift. Moderate cerebral atrophy. No acute cortical infarction. No mass lesion is noted on this unenhanced scan.  IMPRESSION: No acute intracranial  abnormality. Moderate cerebral atrophy. No definite acute cortical infarction.   Electronically Signed   By: Lahoma Crocker M.D.   On: 08/30/2013 20:32    Scheduled Meds: . apixaban  5 mg Oral BID  . atorvastatin  10 mg Oral q1800  . feeding supplement (ENSURE COMPLETE)  237 mL Oral Q24H  . sodium chloride  3 mL Intravenous Q12H  . tamsulosin  0.4 mg Oral QPM   Continuous Infusions: . sodium chloride 150 mL/hr at 09/01/13 1031    Principal Problem:   Hypercalcemia of malignancy Active Problems:   Hypertension   Dementia   Cancer   DVT (deep venous thrombosis)   Protein-calorie malnutrition, severe  Time spent: 30 min    Fleeta Kunde, [place] [place] [facility] Pager [phone]. If 7PM-7AM, please contact night-coverage at [email or web address], password [email or web address] 09/01/2013, 3:04 PM  LOS: 2 days

## 2013-09-01 NOTE — Progress Notes (Signed)
Clinical Social Work Department CLINICAL SOCIAL WORK PLACEMENT NOTE 09/01/2013  Patient:  [name] [name],[name] [name]  Account Number:  [id number] Admit date:  08/30/2013  Clinical Social Worker:  Renold Genta  Date/time:  09/01/2013 02:11 PM  Clinical Social Work is seeking post-discharge placement for this patient at the following level of care:   SKILLED NURSING   (*CSW will update this form in Epic as items are completed)   09/01/2013  Patient/family provided with [place] Department of Clinical Social Work's list of facilities offering this level of care within the geographic area requested by the patient (or if unable, by the patient's family).  09/01/2013  Patient/family informed of their freedom to choose among providers that offer the needed level of care, that participate in Medicare, Medicaid or managed care program needed by the patient, have an available bed and are willing to accept the patient.  09/01/2013  Patient/family informed of [facility]' ownership interest in [facility], as well as of the fact that they are under no obligation to receive care at this facility.  PASARR submitted to EDS on 09/01/2013 PASARR number received on 09/01/2013  FL2 transmitted to all facilities in geographic area requested by pt/family on  09/01/2013 FL2 transmitted to all facilities within larger geographic area on   Patient informed that his/her managed care company has contracts with or will negotiate with  certain facilities, including the following:   [employer] HMO     Patient/family informed of bed offers received:  09/01/2013 Patient chooses bed at [facility] Physician recommends and patient chooses bed at    Patient to be transferred to [facility] on   Patient to be transferred to facility by  Patient and family notified of transfer on  Name of family member notified:    The following physician request were entered in  Epic:   Additional Comments:   [name], [place] Social Worker cell #: [phone]

## 2013-09-01 NOTE — Evaluation (Signed)
Physical Therapy Evaluation Patient Details Name: [name] [name] MRN: [id number] DOB: November 13, 1940 Today's Date: 09/01/2013   History of Present Illness  73 y.o. 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  Clinical Impression  Pt currently with functional limitations due to the deficits listed below (see PT Problem List).  Pt will benefit from skilled PT to increase their independence and safety with mobility to allow discharge to the venue listed below.  Pt fatigues quickly and requiring assist for mobility at this time.  Son reports pt is independent at baseline.  Recommend ST-SNF upon d/c.     Follow Up Recommendations SNF    Equipment Recommendations  Rolling walker with 5" wheels    Recommendations for Other Services       Precautions / Restrictions Precautions Precautions: Fall Restrictions Weight Bearing Restrictions: No      Mobility  Bed Mobility Overal bed mobility: Needs Assistance Bed Mobility: Supine to Sit;Sit to Supine     Supine to sit: Min assist Sit to supine: Supervision   General bed mobility comments: verbal cues for technique, son assisted with trunk  Transfers Overall transfer level: Needs assistance Equipment used: 2 person hand held assist Transfers: Sit to/from Stand Sit to Stand: Min assist         General transfer comment: son assisted to standing holding pt's hands and assist from PT to control descent when returning to sitting  Ambulation/Gait Ambulation/Gait assistance: Min assist Ambulation Distance (Feet): 18 Feet Assistive device: 2 person hand held assist;1 person hand held assist Gait Pattern/deviations: Step-through pattern;Shuffle;Narrow base of support     General Gait Details: initial assist to steady, son  leading pt initially with BIL UEs, ambulated around room/bed  Stairs            Wheelchair Mobility    Modified Rankin (Stroke Patients Only)       Balance                                             Pertinent Vitals/Pain No pain reported, just not feeling well and not hungry    Home Living Family/patient expects to be discharged to:: Private residence Living Arrangements: Children   Type of Home: House Home Access: Stairs to enter   Technical brewer of Steps: 2 Home Layout: Able to live on main level with bedroom/bathroom        Prior Function Level of Independence: Independent         Comments: son reports pt independent with ambulation and ADLs     Hand Dominance        Extremity/Trunk Assessment   Upper Extremity Assessment: Generalized weakness           Lower Extremity Assessment: Generalized weakness         Communication   Communication: No difficulties  Cognition Arousal/Alertness: Awake/alert Behavior During Therapy: Flat affect Overall Cognitive Status: History of cognitive impairments - at baseline                      General Comments      Exercises        Assessment/Plan    PT Assessment Patient needs continued PT services  PT  Diagnosis Difficulty walking;Generalized weakness   PT Problem List Decreased strength;Decreased activity tolerance;Decreased mobility;Decreased balance;Decreased knowledge of use of DME  PT Treatment Interventions DME instruction;Gait training;Functional mobility training;Patient/family education;Therapeutic activities;Therapeutic exercise;Balance training   PT Goals (Current goals can be found in the Care Plan section) Acute Rehab PT Goals PT Goal Formulation: With patient/family Time For Goal Achievement: 09/08/13 Potential to Achieve Goals: Good    Frequency Min 3X/week   Barriers to discharge        Co-evaluation               End of  Session   Activity Tolerance: Patient limited by fatigue Patient left: in bed;with call bell/phone within reach;with bed alarm set;with family/visitor present           Time: 0959-1010 PT Time Calculation (min): 11 min   Charges:   PT Evaluation $Initial PT Evaluation Tier I: 1 Procedure PT Treatments $Gait Training: 8-22 mins   PT G Codes:          Rozella Servello,KATHrine E 09/01/2013, 10:58 AM Carmelia Bake, PT, DPT 09/01/2013 Pager: [phone]

## 2013-09-02 ENCOUNTER — Inpatient Hospital Stay (HOSPITAL_COMMUNITY): Payer: Medicare HMO

## 2013-09-02 LAB — RENAL FUNCTION PANEL
ANION GAP: 15 (ref 5–15)
Albumin: 2.9 g/dL — ABNORMAL LOW (ref 3.5–5.2)
BUN: 9 mg/dL (ref 6–23)
CALCIUM: 12.2 mg/dL — AB (ref 8.4–10.5)
CO2: 20 mEq/L (ref 19–32)
Chloride: 100 mEq/L (ref 96–112)
Creatinine, Ser: 0.74 mg/dL (ref 0.50–1.35)
GFR calc Af Amer: >90 mL/min (ref >90)
GLUCOSE: 118 mg/dL — AB (ref 70–99)
PHOSPHORUS: 1.7 mg/dL — AB (ref 2.3–4.6)
Potassium: 3.9 mEq/L (ref 3.7–5.3)
Sodium: 135 mEq/L — ABNORMAL LOW (ref 137–147)

## 2013-09-02 LAB — PTH, INTACT AND CALCIUM
CALCIUM TOTAL (PTH): 11.4 mg/dL — AB (ref 8.4–10.5)
PTH: 40.2 pg/mL (ref 14.0–72.0)

## 2013-09-02 LAB — CBC
HCT: 33.9 % — ABNORMAL LOW (ref 39.0–52.0)
Hemoglobin: 11.8 g/dL — ABNORMAL LOW (ref 13.0–17.0)
MCH: 29 pg (ref 26.0–34.0)
MCHC: 34.8 g/dL (ref 30.0–36.0)
MCV: 83.3 fL (ref 78.0–100.0)
PLATELETS: 243 10*3/uL (ref 150–400)
RBC: 4.07 MIL/uL — ABNORMAL LOW (ref 4.22–5.81)
RDW: 12.9 % (ref 11.5–15.5)
WBC: 11.4 10*3/uL — ABNORMAL HIGH (ref 4.0–10.5)

## 2013-09-02 LAB — PSA: PSA: 17.12 ng/mL — ABNORMAL HIGH (ref <=4.00)

## 2013-09-02 LAB — VITAMIN D 25 HYDROXY (VIT D DEFICIENCY, FRACTURES): VIT D 25 HYDROXY: 50 ng/mL (ref 30–89)

## 2013-09-02 LAB — PHOSPHORUS: Phosphorus: 1.7 mg/dL — ABNORMAL LOW (ref 2.3–4.6)

## 2013-09-02 MED ORDER — METOPROLOL TARTRATE 12.5 MG HALF TABLET
12.5000 mg | ORAL_TABLET | Freq: Two times a day (BID) | ORAL | Status: DC
  Administered 2013-09-02: 12.5 mg via ORAL
  Filled 2013-09-02: qty 1

## 2013-09-02 MED ORDER — LORAZEPAM 2 MG/ML IJ SOLN
INTRAMUSCULAR | Status: AC
  Filled 2013-09-02: qty 1

## 2013-09-02 MED ORDER — POTASSIUM & SODIUM PHOSPHATES 280-160-250 MG PO PACK: 1.0000 | PACK | Freq: Three times a day (TID) | ORAL | Status: DC

## 2013-09-02 MED ORDER — METOPROLOL TARTRATE 12.5 MG HALF TABLET: 12.5000 mg | ORAL_TABLET | Freq: Two times a day (BID) | ORAL | Status: DC

## 2013-09-02 MED ORDER — FUROSEMIDE 10 MG/ML IJ SOLN
20.0000 mg | Freq: Once | INTRAMUSCULAR | Status: AC
  Administered 2013-09-02: 20 mg via INTRAVENOUS
  Filled 2013-09-02: qty 2

## 2013-09-02 MED ORDER — LORAZEPAM 2 MG/ML IJ SOLN: 0.5000 mg | Freq: Once | INTRAMUSCULAR | Status: DC | PRN

## 2013-09-02 MED ORDER — GADOBENATE DIMEGLUMINE 529 MG/ML IV SOLN
14.0000 mL | Freq: Once | INTRAVENOUS | Status: AC | PRN
  Administered 2013-09-02: 14 mL via INTRAVENOUS

## 2013-09-02 MED ORDER — LORAZEPAM 2 MG/ML IJ SOLN
0.5000 mg | Freq: Once | INTRAMUSCULAR | Status: AC
  Administered 2013-09-02: 0.5 mg via INTRAVENOUS

## 2013-09-02 MED ORDER — POTASSIUM & SODIUM PHOSPHATES 280-160-250 MG PO PACK
2.0000 | PACK | Freq: Three times a day (TID) | ORAL | Status: DC
  Administered 2013-09-02 (×2): 2 via ORAL
  Filled 2013-09-02 (×3): qty 2

## 2013-09-02 NOTE — Progress Notes (Signed)
Report called to Colletta [place], Therapist, sports at [facility]. Will discharge at this time. Setzer, Marchelle Folks

## 2013-09-02 NOTE — Discharge Summary (Signed)
Physician Discharge Summary  Knoah Nedeau XNT:[id number] DOB: 1940-02-16 DOA: 08/30/2013  PCP: Kandice Hams, MD  Admit date: 08/30/2013 Discharge date: 09/02/2013  Recommendations for Outpatient Follow-up:  1. Transfer to SNF for ongoing PT/OT 2. Repeat BMP with phosphorus in 1 week to check calcium and phosphorus.  May give additional dose of pamidronate 90mg  IV or a dose of zoledronic acid once if calcium level continuing to rise.  Followup pending PTH RP 3. Palliative care to follow  4. F/u with urology within 1-2 weeks to discuss treatment options 5. Increase metoprolol as tolerated to goal heart rate of around 90-100 bpm.    Discharge Diagnoses:  Principal Problem:   Hypercalcemia of malignancy Active Problems:   Hypertension   Dementia   Cancer   DVT (deep venous thrombosis)   Protein-calorie malnutrition, severe   Discharge Condition: Stable, improved  Diet recommendation: Regular  Wt Readings from Last 3 Encounters:  09/02/13 66.8 kg (147 lb 4.3 oz)  08/18/13 69.854 kg (154 lb)    History of present illness:  The patient is a 73 year old male with history of hypertension, dementia, DVT, metastatic prostate cancer with metastases to the spine, ribs, pelvis, and skull has been undergoing hormone therapy under the supervision of urology. He presented with abdominal pain, constipation, lethargy and confusion. He was found to have hypercalcemia.  Hospital Course:   Hypercalcemia of malignancy with confusion, constipation, and abdominal pain. His calcium was greater than 15 on admission. His phosphorus level was low, PTH 40 within normal limits, PTH related type is pending.  Vitamin D level was 50.  PSA is 17. His calcium and vitamin D supplements were discontinued.  He was started on calcitonin and IV fluids. His calcium trended down somewhat. He was given a dose of pamidronate on 7/29 (unable to administer zoledronic acid as inpatient). Recommended he have repeat BMP and  phosphorus level done in one week and if his calcium level is still above the upper limit of normal, recommend an additional dose of pamindronate 90 mg IV once.  Confusion/delirium, likely secondary to hypercalcemia in setting of underlying dementia.  MRI brain negative for stroke.    Sinus tachycardia with heart rate in the 110s to 120s. Discussed with the family that this may be secondary to pulmonary embolism given his underlying malignancy and history of previous blood clots.  Recommended he continue his Apixaban indefinitely. Continued IV fluids which not improve his heart rate. His TSH was within normal limits. He was started on low-dose metoprolol which can be increased gradually as an outpatient.  Hypertension, blood pressure remained stable.  History of DVT, continued Apixaban.  Prostate cancer, PSA up to 17 from 10. Recommended he followup with urology. Patient's son was concerned that his hormone therapy was making his father potentially more ill due to increased pain with each injection. He wants to discuss treatment options.  Leukocytosis without signs and symptoms of infection. He remained afebrile. His urinalysis was unremarkable and his chest x-ray was negative. His white blood cell count trended down with IV fluids.  Severe protein calorie nutrition likely secondary to underlying dementia, abdominal pain and constipation. He was started on a regular diet with supplements at the recommendation of nutrition.  Hypokalemia due to malnutrition, he was given oral potassium supplementation  Hypophosphatemia due to malnutrition, recommend continuing potassium phosphate supplements and repeat a phosphorus level in one week.  Normocytic anemia, likely secondary to chronic disease or malignancy. His hemoglobin remained approximately stable.    Consultants:  None  Procedures:  Chest x-ray  CT head  MRI brain  Antibiotics:  None    Discharge Exam: Filed Vitals:   09/02/13 1308   BP: 128/94  Pulse: 124  Temp: 98.4 F (36.9 C)  Resp: 18   Filed Vitals:   09/01/13 1400 09/01/13 2121 09/02/13 0356 09/02/13 1308  BP: 138/80 146/91  128/94  Pulse: 124 116  124  Temp: 98.2 F (36.8 C) 99 F (37.2 C)  98.4 F (36.9 C)  TempSrc: Oral Oral  Oral  Resp: 20 20  18   Height:      Weight:   66.8 kg (147 lb 4.3 oz)   SpO2: 98% 100%  100%    General: Thin BM, No acute distress, trying to climb out of bed  HEENT: NCAT, MMM  Cardiovascular: Tachycardic, RR, nl S1, S2 no mrg, 2+ pulses, warm extremities  Respiratory: CTAB, no increased WOB  Abdomen: NABS, soft, NT/ND  MSK: Normal tone and bulk, no LEE  Neuro: diffusely weak, no focal neurologic deficits    Discharge Instructions      Discharge Instructions   Call MD for:  difficulty breathing, headache or visual disturbances    Complete by:  As directed      Call MD for:  extreme fatigue    Complete by:  As directed      Call MD for:  hives    Complete by:  As directed      Call MD for:  persistant dizziness or light-headedness    Complete by:  As directed      Call MD for:  persistant nausea and vomiting    Complete by:  As directed      Call MD for:  severe uncontrolled pain    Complete by:  As directed      Call MD for:  temperature >100.4    Complete by:  As directed      Diet general    Complete by:  As directed      Increase activity slowly    Complete by:  As directed             Medication List    STOP taking these medications       bicalutamide 50 MG tablet  Commonly known as:  CASODEX     calcium gluconate 500 MG tablet     fexofenadine 180 MG tablet  Commonly known as:  ALLEGRA     potassium chloride 10 MEQ tablet  Commonly known as:  K-DUR     Vitamin D 400 UNITS capsule      TAKE these medications       atorvastatin 10 MG tablet  Commonly known as:  LIPITOR  Take 10 mg by mouth daily with breakfast.     ELIQUIS 5 MG Tabs tablet  Generic drug:  apixaban  Take 5 mg  by mouth 2 (two) times daily.     ESTROGENS CONJUGATED IJ  Inject as directed every 30 (thirty) days. Gets injections for prostate cancer at [facility]     feeding supplement Liqd  Take 237 mLs by mouth 3 (three) times daily with meals.     metoprolol tartrate 12.5 mg Tabs tablet  Commonly known as:  LOPRESSOR  Take 0.5 tablets (12.5 mg total) by mouth 2 (two) times daily.     potassium & sodium phosphates 280-160-250 MG Pack  Commonly known as:  PHOS-NAK  Take 1 packet by mouth 3 (three) times daily.  tamsulosin 0.4 MG Caps capsule  Commonly known as:  FLOMAX  Take 0.4 mg by mouth every evening.       Follow-up Information   Follow up with POLITE,RONALD D, MD. Schedule an appointment as soon as possible for a visit in 1 month.   Specialty:  Internal Medicine   Contact information:   [address] [place] [address] [phone]       The results of significant diagnostics from this hospitalization (including imaging, microbiology, ancillary and laboratory) are listed below for reference.    Significant Diagnostic Studies: Dg Chest 2 View  08/30/2013   CLINICAL DATA:  Prostate cancer.  Weakness.  EXAM: CHEST  2 VIEW  COMPARISON:  None.  FINDINGS: The lungs are clear. Heart size is normal. There is no pneumothorax or effusion. No focal bony abnormality is identified.  IMPRESSION: No acute disease.   Electronically Signed   By: Inge Rise M.D.   On: 08/30/2013 20:26   Ct Head Wo Contrast  08/30/2013   CLINICAL DATA:  Confusion, slurred speech  EXAM: CT HEAD WITHOUT CONTRAST  TECHNIQUE: Contiguous axial images were obtained from the base of the skull through the vertex without intravenous contrast.  COMPARISON:  None.  FINDINGS: No skull fracture is noted. Paranasal sinuses and mastoid air cells are unremarkable.  No intracranial hemorrhage, mass effect or midline shift. Moderate cerebral atrophy. No acute cortical infarction. No mass lesion is  noted on this unenhanced scan.  IMPRESSION: No acute intracranial abnormality. Moderate cerebral atrophy. No definite acute cortical infarction.   Electronically Signed   By: Lahoma Crocker M.D.   On: 08/30/2013 20:32   Mr Jeri Cos HG Contrast  09/02/2013   CLINICAL DATA:  Metastatic prostate cancer. Confusion and difficulty speaking.  EXAM: MRI HEAD WITHOUT AND WITH CONTRAST  TECHNIQUE: Multiplanar, multiecho pulse sequences of the brain and surrounding structures were obtained without and with intravenous contrast.  CONTRAST:  82mL MULTIHANCE GADOBENATE DIMEGLUMINE 529 MG/ML IV SOLN  COMPARISON:  CT head 08/30/2013  FINDINGS: Age-appropriate atrophy. Negative for hydrocephalus. Pituitary normal in size.  Negative for acute infarct. Minimal chronic microvascular ischemic change in the white matter. Minimal chronic microvascular ischemia in the pons. No cortical infarction.  Negative for hemorrhage.  No mass lesion.  Postcontrast imaging reveals no enhancing mass lesion in the brain.  The calvarium is intact without evidence of bony metastatic disease.  Some of the images are degraded by motion.  IMPRESSION: No acute abnormality. Negative for acute infarct. Negative for metastatic disease. No cause for the patient's symptoms.   Electronically Signed   By: Franchot Gallo M.D.   On: 09/02/2013 12:04    Microbiology: No results found for this or any previous visit (from the past 240 hour(s)).   Labs: Basic Metabolic Panel:  Recent Labs Lab 08/30/13 1907 08/30/13 2325 08/31/13 0447 09/01/13 0508 09/01/13 1830 09/02/13 0400  NA 134* 137 138 134*  --  135*  K 3.9 3.6* 3.7 3.3*  --  3.9  CL 95* 101 100 100  --  100  CO2 20 22 22 21   --  20  GLUCOSE 145* 118* 120* 103*  --  118*  BUN 26* 23 19 11   --  9  CREATININE 1.19 1.05 0.96 0.76  --  0.74  CALCIUM >15.0* 14.0* 12.8* 11.1* 11.4* 12.2*  PHOS  --   --   --   --  0.7* 1.7*  1.7*   Liver Function Tests:  Recent Labs Lab 08/30/13 1907  08/30/13 2325 08/31/13 0447 09/02/13 0400  AST 53* 44* 45*  --   ALT 71* 56* 58*  --   ALKPHOS 195* 165* 167*  --   BILITOT 0.6 0.7 0.7  --   PROT 8.0 6.8 6.8  --   ALBUMIN 3.6 3.1* 3.2* 2.9*    Recent Labs Lab 08/30/13 1907  LIPASE 15   No results found for this basename: AMMONIA,  in the last 168 hours CBC:  Recent Labs Lab 08/30/13 1907 08/31/13 0447 09/01/13 0508 09/02/13 0400  WBC 16.7* 14.5* 11.4* 11.4*  NEUTROABS  --  11.1*  --   --   HGB 13.4 11.4* 10.8* 11.8*  HCT 39.0 34.1* 31.6* 33.9*  MCV 86.3 86.3 85.2 83.3  PLT 265 210 204 243   Cardiac Enzymes: No results found for this basename: CKTOTAL, CKMB, CKMBINDEX, TROPONINI,  in the last 168 hours BNP: BNP (last 3 results) No results found for this basename: PROBNP,  in the last 8760 hours CBG:  Recent Labs Lab 08/30/13 1936  GLUCAP 132*    Time coordinating discharge: 45 minutes  Signed:  Takenya Travaglini  [facility] 09/02/2013, 3:19 PM

## 2013-09-02 NOTE — Progress Notes (Signed)
Patient is set to discharge to [facility] today. Patient & son, [name] [name] aware. Discharge packet given to RN, Radonna Ricker. [facility] called for transport.   Clinical Social Work Department CLINICAL SOCIAL WORK PLACEMENT NOTE 09/02/2013  Patient:  [name] [name],[name] [name]  Account Number:  [id number] Admit date:  08/30/2013  Clinical Social Worker:  Renold Genta  Date/time:  09/01/2013 02:11 PM  Clinical Social Work is seeking post-discharge placement for this patient at the following level of care:   SKILLED NURSING   (*CSW will update this form in Epic as items are completed)   09/01/2013  Patient/family provided with [facility] Department of Clinical Social Work's list of facilities offering this level of care within the geographic area requested by the patient (or if unable, by the patient's family).  09/01/2013  Patient/family informed of their freedom to choose among providers that offer the needed level of care, that participate in Medicare, Medicaid or managed care program needed by the patient, have an available bed and are willing to accept the patient.  09/01/2013  Patient/family informed of [facility]' ownership interest in [facility], as well as of the fact that they are under no obligation to receive care at this facility.  PASARR submitted to EDS on 09/01/2013 PASARR number received on 09/01/2013  FL2 transmitted to all facilities in geographic area requested by pt/family on  09/01/2013 FL2 transmitted to all facilities within larger geographic area on   Patient informed that his/her managed care company has contracts with or will negotiate with  certain facilities, including the following:   [facility] HMO     Patient/family informed of bed offers received:  09/01/2013 Patient chooses bed at [facility] Physician recommends and patient chooses bed at    Patient to be transferred to [facility] on  09/02/2013 Patient to  be transferred to facility by [facility] Patient and family notified of transfer on 09/02/2013 Name of family member notified:  patient's son, [name] [name]  The following physician request were entered in Epic:   Additional Comments:   [name], [place] Social Worker cell #: [phone]

## 2013-09-03 ENCOUNTER — Other Ambulatory Visit: Payer: Medicare HMO

## 2013-09-07 LAB — VITAMIN D 1,25 DIHYDROXY
VITAMIN D 1, 25 (OH) TOTAL: 19 pg/mL (ref 18–72)
VITAMIN D3 1, 25 (OH): 19 pg/mL
Vitamin D2 1, 25 (OH)2: <8 pg/mL

## 2013-09-09 LAB — PTH-RELATED PEPTIDE: PTH-related peptide: 27 pg/mL (ref 14–27)

## 2013-09-13 ENCOUNTER — Ambulatory Visit
Admission: RE | Admit: 2013-09-13 | Discharge: 2013-09-13 | Disposition: A | Discharge status: Home / Self Care | Payer: Medicare HMO | Source: Ambulatory Visit | Attending: Gastroenterology | Admitting: Gastroenterology

## 2013-09-13 DIAGNOSIS — R109 Unspecified abdominal pain: Secondary | ICD-10-CM

## 2013-09-13 MED ORDER — IOHEXOL 300 MG/ML  SOLN
100.0000 mL | Freq: Once | INTRAMUSCULAR | Status: AC | PRN
  Administered 2013-09-13: 100 mL via INTRAVENOUS

## 2013-09-14 ENCOUNTER — Encounter (HOSPITAL_COMMUNITY): Payer: Self-pay | Admitting: Emergency Medicine

## 2013-09-14 ENCOUNTER — Inpatient Hospital Stay (HOSPITAL_COMMUNITY)
Admission: EM | Admit: 2013-09-14 | Discharge: 2013-09-16 | DRG: 640 | Disposition: A | Discharge status: Hospice | Payer: Medicare HMO | Attending: Internal Medicine | Admitting: Internal Medicine

## 2013-09-14 ENCOUNTER — Emergency Department (HOSPITAL_COMMUNITY): Payer: Medicare HMO

## 2013-09-14 DIAGNOSIS — D6869 Other thrombophilia: Secondary | ICD-10-CM | POA: Diagnosis present

## 2013-09-14 DIAGNOSIS — E860 Dehydration: Secondary | ICD-10-CM | POA: Diagnosis present

## 2013-09-14 DIAGNOSIS — I82409 Acute embolism and thrombosis of unspecified deep veins of unspecified lower extremity: Secondary | ICD-10-CM

## 2013-09-14 DIAGNOSIS — E43 Unspecified severe protein-calorie malnutrition: Secondary | ICD-10-CM

## 2013-09-14 DIAGNOSIS — Z515 Encounter for palliative care: Secondary | ICD-10-CM

## 2013-09-14 DIAGNOSIS — C801 Malignant (primary) neoplasm, unspecified: Secondary | ICD-10-CM

## 2013-09-14 DIAGNOSIS — I10 Essential (primary) hypertension: Secondary | ICD-10-CM | POA: Diagnosis present

## 2013-09-14 DIAGNOSIS — R310 Gross hematuria: Secondary | ICD-10-CM | POA: Diagnosis present

## 2013-09-14 DIAGNOSIS — G893 Neoplasm related pain (acute) (chronic): Secondary | ICD-10-CM

## 2013-09-14 DIAGNOSIS — R29898 Other symptoms and signs involving the musculoskeletal system: Secondary | ICD-10-CM

## 2013-09-14 DIAGNOSIS — R945 Abnormal results of liver function studies: Secondary | ICD-10-CM

## 2013-09-14 DIAGNOSIS — R319 Hematuria, unspecified: Secondary | ICD-10-CM

## 2013-09-14 DIAGNOSIS — N400 Enlarged prostate without lower urinary tract symptoms: Secondary | ICD-10-CM | POA: Diagnosis present

## 2013-09-14 DIAGNOSIS — F0390 Unspecified dementia without behavioral disturbance: Secondary | ICD-10-CM | POA: Diagnosis present

## 2013-09-14 DIAGNOSIS — Z86718 Personal history of other venous thrombosis and embolism: Secondary | ICD-10-CM | POA: Diagnosis not present

## 2013-09-14 DIAGNOSIS — C7952 Secondary malignant neoplasm of bone marrow: Secondary | ICD-10-CM

## 2013-09-14 DIAGNOSIS — R627 Adult failure to thrive: Secondary | ICD-10-CM

## 2013-09-14 DIAGNOSIS — IMO0002 Reserved for concepts with insufficient information to code with codable children: Secondary | ICD-10-CM

## 2013-09-14 DIAGNOSIS — Z7901 Long term (current) use of anticoagulants: Secondary | ICD-10-CM | POA: Diagnosis not present

## 2013-09-14 DIAGNOSIS — Z87891 Personal history of nicotine dependence: Secondary | ICD-10-CM | POA: Diagnosis not present

## 2013-09-14 DIAGNOSIS — E785 Hyperlipidemia, unspecified: Secondary | ICD-10-CM | POA: Diagnosis present

## 2013-09-14 DIAGNOSIS — C787 Secondary malignant neoplasm of liver and intrahepatic bile duct: Secondary | ICD-10-CM | POA: Diagnosis present

## 2013-09-14 DIAGNOSIS — Z66 Do not resuscitate: Secondary | ICD-10-CM | POA: Diagnosis not present

## 2013-09-14 DIAGNOSIS — R7989 Other specified abnormal findings of blood chemistry: Secondary | ICD-10-CM

## 2013-09-14 DIAGNOSIS — C61 Malignant neoplasm of prostate: Secondary | ICD-10-CM

## 2013-09-14 DIAGNOSIS — R531 Weakness: Secondary | ICD-10-CM

## 2013-09-14 DIAGNOSIS — C7951 Secondary malignant neoplasm of bone: Secondary | ICD-10-CM | POA: Diagnosis present

## 2013-09-14 DIAGNOSIS — C7800 Secondary malignant neoplasm of unspecified lung: Secondary | ICD-10-CM | POA: Diagnosis present

## 2013-09-14 DIAGNOSIS — C799 Secondary malignant neoplasm of unspecified site: Secondary | ICD-10-CM

## 2013-09-14 LAB — URINE MICROSCOPIC-ADD ON

## 2013-09-14 LAB — COMPREHENSIVE METABOLIC PANEL
ALK PHOS: 568 U/L — AB (ref 39–117)
ALT: 137 U/L — ABNORMAL HIGH (ref 0–53)
ALT: 120 U/L — ABNORMAL HIGH (ref 0–53)
ANION GAP: 18 — AB (ref 5–15)
AST: 169 U/L — ABNORMAL HIGH (ref 0–37)
AST: 166 U/L — ABNORMAL HIGH (ref 0–37)
Albumin: 3 g/dL — ABNORMAL LOW (ref 3.5–5.2)
Albumin: 2.4 g/dL — ABNORMAL LOW (ref 3.5–5.2)
Alkaline Phosphatase: 483 U/L — ABNORMAL HIGH (ref 39–117)
Anion gap: 21 — ABNORMAL HIGH (ref 5–15)
BILIRUBIN TOTAL: 2.1 mg/dL — AB (ref 0.3–1.2)
BUN: 35 mg/dL — AB (ref 6–23)
BUN: 31 mg/dL — AB (ref 6–23)
CO2: 21 meq/L (ref 19–32)
CO2: 19 meq/L (ref 19–32)
CREATININE: 1.09 mg/dL (ref 0.50–1.35)
Calcium: >15 mg/dL (ref 8.4–10.5)
Calcium: >15 mg/dL (ref 8.4–10.5)
Chloride: 96 mEq/L (ref 96–112)
Chloride: 100 mEq/L (ref 96–112)
Creatinine, Ser: 1.26 mg/dL (ref 0.50–1.35)
GFR, EST AFRICAN AMERICAN: 65 mL/min — AB (ref >90)
GFR, EST AFRICAN AMERICAN: 77 mL/min — AB (ref >90)
GFR, EST NON AFRICAN AMERICAN: 56 mL/min — AB (ref >90)
GFR, EST NON AFRICAN AMERICAN: 67 mL/min — AB (ref >90)
GLUCOSE: 83 mg/dL (ref 70–99)
Glucose, Bld: 80 mg/dL (ref 70–99)
POTASSIUM: 4.6 meq/L (ref 3.7–5.3)
Potassium: 4.5 mEq/L (ref 3.7–5.3)
Sodium: 138 mEq/L (ref 137–147)
Sodium: 137 mEq/L (ref 137–147)
TOTAL PROTEIN: 7.6 g/dL (ref 6.0–8.3)
Total Bilirubin: 1.8 mg/dL — ABNORMAL HIGH (ref 0.3–1.2)
Total Protein: 6.6 g/dL (ref 6.0–8.3)

## 2013-09-14 LAB — CBC
HCT: 34.5 % — ABNORMAL LOW (ref 39.0–52.0)
HEMOGLOBIN: 11.8 g/dL — AB (ref 13.0–17.0)
MCH: 28.7 pg (ref 26.0–34.0)
MCHC: 34.2 g/dL (ref 30.0–36.0)
MCV: 83.9 fL (ref 78.0–100.0)
Platelets: 397 10*3/uL (ref 150–400)
RBC: 4.11 MIL/uL — ABNORMAL LOW (ref 4.22–5.81)
RDW: 14.2 % (ref 11.5–15.5)
WBC: 19 10*3/uL — ABNORMAL HIGH (ref 4.0–10.5)

## 2013-09-14 LAB — URINALYSIS, ROUTINE W REFLEX MICROSCOPIC
Glucose, UA: NEGATIVE mg/dL
KETONES UR: NEGATIVE mg/dL
Nitrite: NEGATIVE
Protein, ur: 30 mg/dL — AB
SPECIFIC GRAVITY, URINE: 1.024 (ref 1.005–1.030)
UROBILINOGEN UA: 1 mg/dL (ref 0.0–1.0)
pH: 5 (ref 5.0–8.0)

## 2013-09-14 LAB — MAGNESIUM: Magnesium: 2.5 mg/dL (ref 1.5–2.5)

## 2013-09-14 LAB — MRSA PCR SCREENING: MRSA by PCR: NEGATIVE

## 2013-09-14 LAB — PHOSPHORUS: Phosphorus: 3.1 mg/dL (ref 2.3–4.6)

## 2013-09-14 MED ORDER — ASPIRIN EC 81 MG PO TBEC
81.0000 mg | DELAYED_RELEASE_TABLET | Freq: Every day | ORAL | Status: DC
  Administered 2013-09-14: 81 mg via ORAL
  Filled 2013-09-14 (×2): qty 1

## 2013-09-14 MED ORDER — SODIUM CHLORIDE 0.9 % IJ SOLN: 3.0000 mL | Freq: Two times a day (BID) | INTRAMUSCULAR | Status: DC

## 2013-09-14 MED ORDER — TAMSULOSIN HCL 0.4 MG PO CAPS
0.4000 mg | ORAL_CAPSULE | Freq: Every evening | ORAL | Status: DC
  Administered 2013-09-14: 0.4 mg via ORAL
  Filled 2013-09-14 (×3): qty 1

## 2013-09-14 MED ORDER — ATORVASTATIN CALCIUM 10 MG PO TABS
10.0000 mg | ORAL_TABLET | Freq: Every day | ORAL | Status: DC
  Filled 2013-09-14: qty 1

## 2013-09-14 MED ORDER — LORAZEPAM 0.5 MG PO TABS
0.5000 mg | ORAL_TABLET | Freq: Once | ORAL | Status: AC
  Administered 2013-09-14: 0.5 mg via ORAL
  Filled 2013-09-14: qty 1

## 2013-09-14 MED ORDER — METOPROLOL TARTRATE 12.5 MG HALF TABLET
12.5000 mg | ORAL_TABLET | Freq: Two times a day (BID) | ORAL | Status: DC
  Administered 2013-09-14 – 2013-09-15 (×2): 12.5 mg via ORAL
  Filled 2013-09-14 (×5): qty 1

## 2013-09-14 MED ORDER — CALCITONIN (SALMON) 200 UNIT/ML IJ SOLN
100.0000 [IU] | Freq: Once | INTRAMUSCULAR | Status: AC
  Administered 2013-09-14: 100 [IU] via INTRAMUSCULAR
  Filled 2013-09-14: qty 0.5

## 2013-09-14 MED ORDER — ONDANSETRON HCL 4 MG/2ML IJ SOLN: 4.0000 mg | Freq: Four times a day (QID) | INTRAMUSCULAR | Status: DC | PRN

## 2013-09-14 MED ORDER — ENSURE COMPLETE PO LIQD
237.0000 mL | Freq: Two times a day (BID) | ORAL | Status: DC
  Administered 2013-09-14 – 2013-09-16 (×3): 237 mL via ORAL

## 2013-09-14 MED ORDER — SODIUM CHLORIDE 0.9 % IV BOLUS (SEPSIS)
500.0000 mL | Freq: Once | INTRAVENOUS | Status: AC
  Administered 2013-09-14: 500 mL via INTRAVENOUS

## 2013-09-14 MED ORDER — ZOLPIDEM TARTRATE 5 MG PO TABS: 5.0000 mg | ORAL_TABLET | Freq: Every evening | ORAL | Status: DC | PRN

## 2013-09-14 MED ORDER — OXYCODONE HCL 5 MG PO TABS
5.0000 mg | ORAL_TABLET | ORAL | Status: DC | PRN
  Administered 2013-09-15: 5 mg via ORAL
  Filled 2013-09-14: qty 1

## 2013-09-14 MED ORDER — MAGNESIUM CITRATE PO SOLN: 1.0000 | Freq: Once | ORAL | Status: AC | PRN

## 2013-09-14 MED ORDER — FOLIC ACID 1 MG PO TABS
1.0000 mg | ORAL_TABLET | Freq: Every day | ORAL | Status: DC
  Administered 2013-09-14: 1 mg via ORAL
  Filled 2013-09-14 (×2): qty 1

## 2013-09-14 MED ORDER — DOCUSATE SODIUM 100 MG PO CAPS
100.0000 mg | ORAL_CAPSULE | Freq: Two times a day (BID) | ORAL | Status: DC
  Administered 2013-09-14: 100 mg via ORAL
  Filled 2013-09-14 (×5): qty 1

## 2013-09-14 MED ORDER — ACETAMINOPHEN 325 MG PO TABS: 650.0000 mg | ORAL_TABLET | Freq: Four times a day (QID) | ORAL | Status: DC | PRN

## 2013-09-14 MED ORDER — ONDANSETRON HCL 4 MG PO TABS: 4.0000 mg | ORAL_TABLET | Freq: Four times a day (QID) | ORAL | Status: DC | PRN

## 2013-09-14 MED ORDER — VITAMIN B-1 100 MG PO TABS
100.0000 mg | ORAL_TABLET | Freq: Every day | ORAL | Status: DC
  Administered 2013-09-14: 100 mg via ORAL
  Filled 2013-09-14 (×2): qty 1

## 2013-09-14 MED ORDER — SODIUM CHLORIDE 0.9 % IV SOLN
INTRAVENOUS | Status: DC
  Administered 2013-09-15 – 2013-09-16 (×2): via INTRAVENOUS

## 2013-09-14 MED ORDER — SODIUM CHLORIDE 0.9 % IV SOLN
90.0000 mg | Freq: Once | INTRAVENOUS | Status: AC
  Administered 2013-09-14: 90 mg via INTRAVENOUS
  Filled 2013-09-14: qty 10

## 2013-09-14 MED ORDER — ACETAMINOPHEN 650 MG RE SUPP: 650.0000 mg | Freq: Four times a day (QID) | RECTAL | Status: DC | PRN

## 2013-09-14 MED ORDER — SODIUM CHLORIDE 0.9 % IV SOLN
INTRAVENOUS | Status: AC
  Administered 2013-09-14: 20:00:00 via INTRAVENOUS

## 2013-09-14 MED ORDER — ADULT MULTIVITAMIN W/MINERALS CH
1.0000 | ORAL_TABLET | Freq: Every day | ORAL | Status: DC
  Administered 2013-09-14: 1 via ORAL
  Filled 2013-09-14 (×2): qty 1

## 2013-09-14 MED ORDER — ALUM & MAG HYDROXIDE-SIMETH 200-200-20 MG/5ML PO SUSP: 30.0000 mL | Freq: Four times a day (QID) | ORAL | Status: DC | PRN

## 2013-09-14 MED ORDER — SODIUM CHLORIDE 0.9 % IV BOLUS (SEPSIS)
1000.0000 mL | Freq: Once | INTRAVENOUS | Status: AC
  Administered 2013-09-14: 1000 mL via INTRAVENOUS

## 2013-09-14 MED ORDER — BISACODYL 10 MG RE SUPP: 10.0000 mg | Freq: Every day | RECTAL | Status: DC | PRN

## 2013-09-14 MED ORDER — APIXABAN 5 MG PO TABS
5.0000 mg | ORAL_TABLET | Freq: Two times a day (BID) | ORAL | Status: DC
  Administered 2013-09-14: 5 mg via ORAL
  Filled 2013-09-14 (×3): qty 1

## 2013-09-14 NOTE — ED Notes (Signed)
Pt given water per request

## 2013-09-14 NOTE — Progress Notes (Signed)
  CARE MANAGEMENT ED NOTE 09/14/2013  Patient:  [name] [name],[name] [name]   Account Number:  [id number]  Date Initiated:  09/14/2013  Documentation initiated by:  Jackelyn Poling  Subjective/Objective Assessment:   73 yr old [place] pt left here about week ago and went to [employer] but they have lost pt's teeth and pt hasn't eaten full meal, unable to walk, incontinent and no strength.  Pt's son wants to speak to social worker     Subjective/Objective Assessment Detail:   about maybe getting him strong enough to take him home with Hospice if pt is a candidate  pcp [facility]  last admission 08/30/13 to 09/02/13 and d/c to [facility] rehab center  Son states the [facility] rehab had pt's first conference 09/14/13 and he decided to remove pt from the facility He states he prefers to take pt anywhere "other than there" States he was provided with medicaid and VA forms but have not completed them yet.  Report pt has VA coverage but does not know if or what % the pt may be covered by Veteran's administration (VA)  Pt noted to be fidgeting in bed pulling on cords with son frequently redirecting him.     Action/Plan:   CM consulted by ED SW for possible assist with home health or home hospice needs. CM reviewed EPIC CM spoke with son & pt Reviewed resources listed below with son who is still unsure of pt disposition   Action/Plan Detail:   Anticipated DC Date:  10-10-13     Status Recommendation to Physician:   Result of Recommendation:    Other ED Services  Consult Working Plan   In-house referral  Clinical Social Worker   DC Forensic scientist  Other  Outpatient Services - Pt will follow up    Choice offered to / List presented to:            Status of service:  Completed, signed off  ED Comments:   ED Comments Detail:  CM reviewed in details medicare guidelines, home hospice & home health [facility]) (length of stay in home, types of [facility] staff available, coverage, primary  caregiver, up to 24 hrs before services may be started), Private duty nursing (PDN-coverage, length of stay in the home types of staff available), assisted living (ASL- coverage, services offered) and  CM provided son with a list of [place] home health agencies, PDN, and information on PACE, Hospice facilities, Location manager.  States that a family member or friend goes to [employer]. Son encouraged to read through resources to help decide best plan of care for the pt

## 2013-09-14 NOTE — ED Provider Notes (Addendum)
CSN: [id number]     Arrival date & time 09/14/13  1536 History   First MD Initiated Contact with Patient 09/14/13 1632     Chief Complaint  Patient presents with  . Weight Loss     (Consider location/radiation/quality/duration/timing/severity/associated sxs/prior Treatment) The history is provided by the patient and a relative. The history is limited by the condition of the patient.  pt with hx prostate ca, hypercalcemia related to malignancy, w generalized weakness and weight loss. Was admitted for same and discharged 09/03/13.  Was discharged to e[facility], and son took home from [facility] today. Notes unspecified wt loss, poor appetite, general weakness, inability to ambulate due to generalized weakness/deconditioning.  No acute or abrupt change today. Son states he just needs to know if acute issue that is treatable or if needs hospice care. No fevers. Pt v limited/poor historian - level 5 caveat.  Pt denies pain. No fevers.     Past Medical History  Diagnosis Date  . Hypertension   . Dementia   . Cancer     prostate, spine ribs, left pelvis, and skull  . DVT (deep venous thrombosis)    History reviewed. No pertinent past surgical history. No family history on file. History  Substance Use Topics  . Smoking status: Former Research scientist (life sciences)  . Smokeless tobacco: Never Used  . Alcohol Use: No    Review of Systems  Constitutional: Negative for fever and chills.  HENT: Negative for sore throat.   Eyes: Negative for redness.  Respiratory: Negative for cough and shortness of breath.   Cardiovascular: Negative for chest pain and leg swelling.  Gastrointestinal: Negative for vomiting, abdominal pain and diarrhea.  Genitourinary: Negative for dysuria and flank pain.  Musculoskeletal: Negative for back pain and neck pain.  Skin: Negative for rash.  Neurological: Negative for weakness, numbness and headaches.  Hematological: Does not bruise/bleed easily.  Psychiatric/Behavioral: Negative for agitation.       Allergies  Ace inhibitors and Namenda  Home Medications   Prior to Admission medications   Medication Sig Start Date End Date Taking? Authorizing Provider  apixaban (ELIQUIS) 5 MG TABS tablet Take 5 mg by mouth 2 (two) times daily.   Yes Historical Provider, MD  atorvastatin (LIPITOR) 10 MG tablet Take 10 mg by mouth daily with breakfast.   Yes Historical Provider, MD  ESTROGENS CONJUGATED IJ Inject as directed every 30 (thirty) days. Gets injections for prostate cancer at [facility]   Yes Historical Provider, MD  feeding supplement ([place]) LIQD Take 237 mLs by mouth 2 (two) times daily.    Yes Historical Provider, MD  metoprolol tartrate (LOPRESSOR) 12.5 mg TABS tablet Take 12.5 mg by mouth 2 (two) times daily. 09/02/13  Yes Janece Canterbury, MD  potassium & sodium phosphates (PHOS-NAK) 280-160-250 MG PACK Take 1 packet by mouth 3 (three) times daily. 09/02/13  Yes Janece Canterbury, MD  tamsulosin (FLOMAX) 0.4 MG CAPS capsule Take 0.4 mg by mouth every evening.   Yes Historical Provider, MD   BP 135/83  Pulse 126  Temp(Src) 98.3 F (36.8 C) (Oral)  Resp 20  SpO2 92% Physical Exam  Nursing note and vitals reviewed. Constitutional: No distress.  HENT:  Head: Atraumatic.  Mouth/Throat: Oropharynx is clear and moist.  Eyes: Conjunctivae are normal. Pupils are equal, round, and reactive to light.  Neck: Normal range of motion. Neck supple. No tracheal deviation present.  Cardiovascular: Normal rate, regular rhythm, normal heart sounds and intact distal pulses.  Exam reveals no  gallop and no friction rub.   No murmur heard. Pulmonary/Chest: Effort normal and breath sounds normal. No accessory muscle usage. No respiratory distress.  Abdominal: Soft. Bowel sounds are normal. He exhibits no distension and no mass. There is no tenderness. There is no rebound and no guarding.  Genitourinary:  No cva tenderness. Normal ext genitalia.   Musculoskeletal: Normal range  of motion. He exhibits no edema.  Neurological: He is alert.  Moves bil ext purposefully.   Skin: Skin is warm and dry. No rash noted. He is not diaphoretic.  Psychiatric: He has a normal mood and affect.    ED Course  Procedures (including critical care time) Labs Review  Results for orders placed during the hospital encounter of 09/14/13  CBC      Result Value Ref Range   WBC 19.0 (*) 4.0 - 10.5 K/uL   RBC 4.11 (*) 4.22 - 5.81 MIL/uL   Hemoglobin 11.8 (*) 13.0 - 17.0 g/dL   HCT 34.5 (*) 39.0 - 52.0 %   MCV 83.9  78.0 - 100.0 fL   MCH 28.7  26.0 - 34.0 pg   MCHC 34.2  30.0 - 36.0 g/dL   RDW 14.2  11.5 - 15.5 %   Platelets 397  150 - 400 K/uL  COMPREHENSIVE METABOLIC PANEL      Result Value Ref Range   Sodium 138  137 - 147 mEq/L   Potassium 4.6  3.7 - 5.3 mEq/L   Chloride 96  96 - 112 mEq/L   CO2 21  19 - 32 mEq/L   Glucose, Bld 80  70 - 99 mg/dL   BUN 35 (*) 6 - 23 mg/dL   Creatinine, Ser 1.26  0.50 - 1.35 mg/dL   Calcium >15.0 (*) 8.4 - 10.5 mg/dL   Total Protein 7.6  6.0 - 8.3 g/dL   Albumin 3.0 (*) 3.5 - 5.2 g/dL   AST 169 (*) 0 - 37 U/L   ALT 137 (*) 0 - 53 U/L   Alkaline Phosphatase 568 (*) 39 - 117 U/L   Total Bilirubin 2.1 (*) 0.3 - 1.2 mg/dL   GFR calc non Af Amer 56 (*) >90 mL/min   GFR calc Af Amer 65 (*) >90 mL/min   Anion gap 21 (*) 5 - 15  MAGNESIUM      Result Value Ref Range   Magnesium 2.5  1.5 - 2.5 mg/dL  PHOSPHORUS      Result Value Ref Range   Phosphorus 3.1  2.3 - 4.6 mg/dL   Dg Chest 2 View  09/14/2013   CLINICAL DATA:  Weakness, weight loss, history hypertension, prostate cancer  EXAM: CHEST  2 VIEW  COMPARISON:  08/30/2013 ; CT abdomen and pelvis 09/13/2013  FINDINGS: Normal heart size and pulmonary vascularity.  Tortuous aorta with atherosclerotic calcification.  Minimal bibasilar atelectasis.  No definite infiltrate, pleural effusion or pneumothorax.  Nodular density projects over the inferior RIGHT lung laterally on the PA view, question  pulmonary nodule versus sclerotic focus within the anterior RIGHT fifth rib.  Additional subtle nodular foci are seen within additional bones suspicious for sclerotic osseous metastases.  IMPRESSION: Minimal bibasilar atelectasis.  Questionable sclerotic osseous metastases with additional sclerotic osseous metastasis versus a pulmonary nodule/metastasis at RIGHT lung base.   Electronically Signed   By: Lavonia Dana M.D.   On: 09/14/2013 17:33   Dg Chest 2 View  08/30/2013   CLINICAL DATA:  Prostate cancer.  Weakness.  EXAM: CHEST  2 VIEW  COMPARISON:  None.  FINDINGS: The lungs are clear. Heart size is normal. There is no pneumothorax or effusion. No focal bony abnormality is identified.  IMPRESSION: No acute disease.   Electronically Signed   By: Inge Rise M.D.   On: 08/30/2013 20:26   Ct Head Wo Contrast  08/30/2013   CLINICAL DATA:  Confusion, slurred speech  EXAM: CT HEAD WITHOUT CONTRAST  TECHNIQUE: Contiguous axial images were obtained from the base of the skull through the vertex without intravenous contrast.  COMPARISON:  None.  FINDINGS: No skull fracture is noted. Paranasal sinuses and mastoid air cells are unremarkable.  No intracranial hemorrhage, mass effect or midline shift. Moderate cerebral atrophy. No acute cortical infarction. No mass lesion is noted on this unenhanced scan.  IMPRESSION: No acute intracranial abnormality. Moderate cerebral atrophy. No definite acute cortical infarction.   Electronically Signed   By: Lahoma Crocker M.D.   On: 08/30/2013 20:32   Mr Jeri Cos ER Contrast  09/02/2013   CLINICAL DATA:  Metastatic prostate cancer. Confusion and difficulty speaking.  EXAM: MRI HEAD WITHOUT AND WITH CONTRAST  TECHNIQUE: Multiplanar, multiecho pulse sequences of the brain and surrounding structures were obtained without and with intravenous contrast.  CONTRAST:  31mL MULTIHANCE GADOBENATE DIMEGLUMINE 529 MG/ML IV SOLN  COMPARISON:  CT head 08/30/2013  FINDINGS: Age-appropriate  atrophy. Negative for hydrocephalus. Pituitary normal in size.  Negative for acute infarct. Minimal chronic microvascular ischemic change in the white matter. Minimal chronic microvascular ischemia in the pons. No cortical infarction.  Negative for hemorrhage.  No mass lesion.  Postcontrast imaging reveals no enhancing mass lesion in the brain.  The calvarium is intact without evidence of bony metastatic disease.  Some of the images are degraded by motion.  IMPRESSION: No acute abnormality. Negative for acute infarct. Negative for metastatic disease. No cause for the patient's symptoms.   Electronically Signed   By: Franchot Gallo M.D.   On: 09/02/2013 12:04   Ct Abdomen Pelvis W Contrast  09/13/2013   CLINICAL DATA:  General abdominal pain. Weight loss, loss of appetite. History of prostate cancer with unknown metastases currently on hormone therapy.  EXAM: CT ABDOMEN AND PELVIS WITH CONTRAST  TECHNIQUE: Multidetector CT imaging of the abdomen and pelvis was performed using the standard protocol following bolus administration of intravenous contrast.  CONTRAST:  123mL OMNIPAQUE IOHEXOL 300 MG/ML  SOLN  COMPARISON:  None.  FINDINGS: Multiple pulmonary nodules within the lower lobes bilaterally. Index right lower lobe nodule on image 8 measures 8 mm. Numerous other smaller nodules in both lower lobes. Nodule also seen within the lingula anteriorly on image 6. No pleural effusions.  Innumerable low-density lesions throughout the liver. The largest is located centrally, measuring 7.5 x 5.6 cm on image 39.  Spleen, pancreas, adrenals are unremarkable. Benign appearing cyst in the midpole of the left kidney measures 2 cm. Right kidney unremarkable. No hydronephrosis.  Prostate is enlarged with central low density suggesting necrosis. Prostate measures 8.0 x 7.0 cm on image 83. Probable extracapsular extension. Multiple periprostatic soft tissue nodules and masses, the largest in the left posterior pelvis measuring 2.6  x 2.0 cm.  Bladder wall is diffusely thickened likely related to a degree of bladder outlet obstruction.  Stomach, large and small bowel are unremarkable. No free fluid or free air.  Innumerable sclerotic lesions throughout the visualized thoracic and lumbar spine, lower ribs, bony pelvis and proximal femurs. Destructive lesion noted within the left iliac wing with adjacent soft tissue  masses.  IMPRESSION: Enlarged prostate with central low density suggesting necrosis. Irregular margins and surrounding periprostatic soft tissue nodules compatible with extracapsular extension.  Innumerable lesions throughout the liver compatible with metastases.  Numerous bilateral lower lobe pulmonary nodule compatible with metastases.  Innumerable sclerotic lesions throughout the visualized bony structures compatible with metastases. Destructive lesions within the left iliac wing.   Electronically Signed   By: Rolm Baptise M.D.   On: 09/13/2013 14:57    Date: 09/14/2013  Rate: 121  Rhythm: sinus tachycardia  QRS Axis: left  Intervals: normal  ST/T Wave abnormalities: nonspecific T wave changes  Conduction Disutrbances:none  Narrative Interpretation:   Old EKG Reviewed: changes noted    MDM  Iv ns bolus. Labs. Continuous pulse ox and monitor. o2 Lane. ecg.  Reviewed nursing notes and prior charts for additional history.   Calcium level extremely high.  Additional ns boluses.  Calcitonin and pamidronate given.  Additional ns iv.  Recent ct results noted.   Med service called for admission.  CRITICAL CARE  RE severe hypercalcemia, weakness, failure to thrive, metastatic cancer, liver mets, elevated lfts.  Performed by: Mirna Mires Total critical care time: 35 Critical care time was exclusive of separately billable procedures and treating other patients. Critical care was necessary to treat or prevent imminent or life-threatening deterioration. Critical care was time spent personally by me on the  following activities: development of treatment plan with patient and/or surrogate as well as nursing, discussions with consultants, evaluation of patient's response to treatment, examination of patient, obtaining history from patient or surrogate, ordering and performing treatments and interventions, ordering and review of laboratory studies, ordering and review of radiographic studies, pulse oximetry and re-evaluation of patient's condition.       Mirna Mires, MD 09/14/13 [phone]

## 2013-09-14 NOTE — Progress Notes (Signed)
Clinical Social Work Department BRIEF PSYCHOSOCIAL ASSESSMENT 09/14/2013  Patient:  [name],[name]     Account Number:  [id number]     Admit date:  09/14/2013  Clinical Social Worker:  Luretha Rued  Date/Time:  09/14/2013 04:30 PM  Referred by:  CSW  Date Referred:  09/14/2013  Other Referral:   Interview type:  Family Other interview type:   Pt did not respond to any questions b/o diagnosis of dementia    PSYCHOSOCIAL DATA Living Status:  FACILITY Admitted from facility:  [facility] Level of care:  [place] Primary support name:  [name] [name] Primary support relationship to patient:  CHILD, ADULT Degree of support available:   high level    CURRENT CONCERNS  Other Concerns:    SOCIAL WORK ASSESSMENT / PLAN CSW met with patient and son at bedside to complete this assessment.  Patient is alert to self only.  Patient has a diagnosis of dementia and need constant redirection by son during interview.  The son is requesting to take his father back home with him with assistance because he is not please with the care at his current residence with the [facility].  He is comfortable with any inhome resources available for his father but he is not willing to take his father back to that facility or anyother faciltiy.  He says his father is a veteran is has VA benefits and has Magazine features editor.  He would like to see if his father could be assessed by hospice also.   Assessment/plan status:  Psychosocial Support/Ongoing Assessment of Needs Other assessment/ plan:   Information/referral to community resources:   [facility], [facility], [facility]    PATIENT'S/FAMILY'S RESPONSE TO PLAN OF CARE: The son expressed his appreciation for the support of the social work department.     Clinical Social Work Department BRIEF PSYCHOSOCIAL ASSESSMENT 09/14/2013  Patient:  [name],[name]     Account Number:  [id number]     Admit date:   09/14/2013  Clinical Social Worker:  Luretha Rued  Date/Time:  09/14/2013 04:30 PM  Referred by:  CSW  Date Referred:  09/14/2013  Other Referral:   Interview type:  Family Other interview type:   Pt did not respond to any questions b/o diagnosis of dementia    PSYCHOSOCIAL DATA Living Status:  FACILITY Admitted from facility:  [facility] Level of care:  [place] Primary support name:  [name] [name] Primary support relationship to patient:  CHILD, ADULT Degree of support available:   high level    CURRENT CONCERNS  Other Concerns:    SOCIAL WORK ASSESSMENT / PLAN CSW met with patient and son at bedside to complete this assessment.  Patient is alert to self only.  Patient has a diagnosis of dementia and need constant redirection by son during interview.  The son is requesting to take his father back home with him with assistance because he is not please with the care at his current residence with the [facility].  He is comfortable with any inhome resources available for his father but he is not willing to take his father back to that facility or anyother faciltiy.  He says his father is a veteran is has VA benefits and has Magazine features editor.  He would like to see if his father could be assessed by hospice also.   Assessment/plan status:  Psychosocial Support/Ongoing Assessment of Needs Other assessment/ plan:   Information/referral to community resources:   [facility], [facility],  [facility]    PATIENT'S/FAMILY'S RESPONSE TO PLAN OF CARE: The son expressed his appreciation for the support of the social work department.     Chesley Noon, MSW, [place], 09/14/2013 Evening Clinical Social Worker [phone]

## 2013-09-14 NOTE — ED Notes (Signed)
Pt left here about week ago and went to [facility] but they have lost pt's teeth and pt hasnt eaten full meal, unable to walk, incontinent and no strength.  Pt's son wants to speak to social worker about maybe getting him strong enough to take him home with Hospice.

## 2013-09-14 NOTE — H&P (Signed)
[facility] History and Physical  [name] [name] DTO:[id number] DOB: [birth year] DOA: 09/14/2013  Referring physician: Lajean Saver, MD PCP: Kandice Hams, MD   Chief Complaint: Hypercalcemia  HPI: [name] [name] is a 73 y.o. male recently admitted with hypercalcemia presents back to the ED with similar complaints. Patient was discharged after treatment for the same. Patients son states that he has been having a decline in his status over the course of his stay at a SNF. He states he has had poor intake he has not been drinking well. He has not been very active. Patient has noted increased weakness. Also has had a CT scan done which did shows mets to the liver. Patient was evaluated in the ED with a calcium greater than 15 and was started on IV fluids as well as calcitonin dose was given along with aredia   Review of Systems:  Constitutional:  No weight loss, night sweats, chills, ++fatigue.  HEENT:  No headaches  Cardio-vascular:  No chest pain, Orthopnea, PND, swelling in lower extremities, anasarca, dizziness, palpitations  GI:  No heartburn, indigestion, ++abdominal pain, no vomiting, no diarrhea, ++change in bowel habits, ++loss of appetite  Resp:  No shortness of breath with exertion or at rest. No excess mucus, no productive cough, No non-productive cough, No coughing up of blood  Skin:  no rash or lesions.  GU:  H/o Prostate cancer  Musculoskeletal:  No joint pain or swelling. No decreased range of motion.  ++back pain.  Psych:  ++depression. ++memory loss.   Past Medical History  Diagnosis Date  . Hypertension   . Dementia   . Cancer     prostate, spine ribs, left pelvis, and skull  . DVT (deep venous thrombosis)    Past Surgical History  Procedure Laterality Date  . No past surgeries     Social History:  reports that he quit smoking about 25 years ago. He has never used smokeless tobacco. He reports that he does not drink alcohol or use illicit  drugs.  Allergies  Allergen Reactions  . Ace Inhibitors Cough  . Namenda [Memantine Hcl] Other (See Comments)    Confusion     History reviewed. No pertinent family history.   Prior to Admission medications   Medication Sig Start Date End Date Taking? Authorizing Provider  apixaban (ELIQUIS) 5 MG TABS tablet Take 5 mg by mouth 2 (two) times daily.   Yes Historical Provider, MD  atorvastatin (LIPITOR) 10 MG tablet Take 10 mg by mouth daily with breakfast.   Yes Historical Provider, MD  ESTROGENS CONJUGATED IJ Inject as directed every 30 (thirty) days. Gets injections for prostate cancer at [facility]   Yes Historical Provider, MD  feeding supplement ([place]) LIQD Take 237 mLs by mouth 2 (two) times daily.    Yes Historical Provider, MD  metoprolol tartrate (LOPRESSOR) 12.5 mg TABS tablet Take 12.5 mg by mouth 2 (two) times daily. 09/02/13  Yes Janece Canterbury, MD  potassium & sodium phosphates (PHOS-NAK) 280-160-250 MG PACK Take 1 packet by mouth 3 (three) times daily. 09/02/13  Yes Janece Canterbury, MD  tamsulosin (FLOMAX) 0.4 MG CAPS capsule Take 0.4 mg by mouth every evening.   Yes Historical Provider, MD   Physical Exam: Filed Vitals:   09/14/13 1814 09/14/13 1830 09/14/13 1900 09/14/13 1930  BP: 142/82 145/82 141/78 144/84  Pulse: 121 121 122 121  Temp: 98.3 F (36.8 C)     TempSrc: Oral     Resp: 24 22 26  26  SpO2: 94% 93% 96% 94%    Wt Readings from Last 3 Encounters:  09/02/13 66.8 kg (147 lb 4.3 oz)  08/18/13 69.854 kg (154 lb)    General:  Appears calm and comfortable cachectic Eyes: PERRL, normal lids, irises & conjunctiva ENT: grossly normal hearing, lips & tongue Neck: no LAD, masses or thyromegaly Cardiovascular: RRR, no m/r/g. No LE edema. Respiratory: CTA bilaterally, no w/r/r. Normal respiratory effort. Abdomen: soft, ntnd ++enlarged liver Skin: no rash or induration seen on limited exam Musculoskeletal: grossly normal tone  BUE/BLE Psychiatric: grossly normal mood speech fluent and appropriate Neurologic: grossly non-focal.          Labs on Admission:  Basic Metabolic Panel:  Recent Labs Lab 09/14/13 1658  NA 138  K 4.6  CL 96  CO2 21  GLUCOSE 80  BUN 35*  CREATININE 1.26  CALCIUM >15.0*  MG 2.5  PHOS 3.1   Liver Function Tests:  Recent Labs Lab 09/14/13 1658  AST 169*  ALT 137*  ALKPHOS 568*  BILITOT 2.1*  PROT 7.6  ALBUMIN 3.0*   No results found for this basename: LIPASE, AMYLASE,  in the last 168 hours No results found for this basename: AMMONIA,  in the last 168 hours CBC:  Recent Labs Lab 09/14/13 1658  WBC 19.0*  HGB 11.8*  HCT 34.5*  MCV 83.9  PLT 397   Cardiac Enzymes: No results found for this basename: CKTOTAL, CKMB, CKMBINDEX, TROPONINI,  in the last 168 hours  BNP (last 3 results) No results found for this basename: PROBNP,  in the last 8760 hours CBG: No results found for this basename: GLUCAP,  in the last 168 hours  Radiological Exams on Admission: Dg Chest 2 View  09/14/2013   CLINICAL DATA:  Weakness, weight loss, history hypertension, prostate cancer  EXAM: CHEST  2 VIEW  COMPARISON:  08/30/2013 ; CT abdomen and pelvis 09/13/2013  FINDINGS: Normal heart size and pulmonary vascularity.  Tortuous aorta with atherosclerotic calcification.  Minimal bibasilar atelectasis.  No definite infiltrate, pleural effusion or pneumothorax.  Nodular density projects over the inferior RIGHT lung laterally on the PA view, question pulmonary nodule versus sclerotic focus within the anterior RIGHT fifth rib.  Additional subtle nodular foci are seen within additional bones suspicious for sclerotic osseous metastases.  IMPRESSION: Minimal bibasilar atelectasis.  Questionable sclerotic osseous metastases with additional sclerotic osseous metastasis versus a pulmonary nodule/metastasis at RIGHT lung base.   Electronically Signed   By: Lavonia Dana M.D.   On: 09/14/2013 17:33   Ct  Abdomen Pelvis W Contrast  09/13/2013   CLINICAL DATA:  General abdominal pain. Weight loss, loss of appetite. History of prostate cancer with unknown metastases currently on hormone therapy.  EXAM: CT ABDOMEN AND PELVIS WITH CONTRAST  TECHNIQUE: Multidetector CT imaging of the abdomen and pelvis was performed using the standard protocol following bolus administration of intravenous contrast.  CONTRAST:  159mL OMNIPAQUE IOHEXOL 300 MG/ML  SOLN  COMPARISON:  None.  FINDINGS: Multiple pulmonary nodules within the lower lobes bilaterally. Index right lower lobe nodule on image 8 measures 8 mm. Numerous other smaller nodules in both lower lobes. Nodule also seen within the lingula anteriorly on image 6. No pleural effusions.  Innumerable low-density lesions throughout the liver. The largest is located centrally, measuring 7.5 x 5.6 cm on image 39.  Spleen, pancreas, adrenals are unremarkable. Benign appearing cyst in the midpole of the left kidney measures 2 cm. Right kidney unremarkable. No hydronephrosis.  Prostate is enlarged with central low density suggesting necrosis. Prostate measures 8.0 x 7.0 cm on image 83. Probable extracapsular extension. Multiple periprostatic soft tissue nodules and masses, the largest in the left posterior pelvis measuring 2.6 x 2.0 cm.  Bladder wall is diffusely thickened likely related to a degree of bladder outlet obstruction.  Stomach, large and small bowel are unremarkable. No free fluid or free air.  Innumerable sclerotic lesions throughout the visualized thoracic and lumbar spine, lower ribs, bony pelvis and proximal femurs. Destructive lesion noted within the left iliac wing with adjacent soft tissue masses.  IMPRESSION: Enlarged prostate with central low density suggesting necrosis. Irregular margins and surrounding periprostatic soft tissue nodules compatible with extracapsular extension.  Innumerable lesions throughout the liver compatible with metastases.  Numerous bilateral  lower lobe pulmonary nodule compatible with metastases.  Innumerable sclerotic lesions throughout the visualized bony structures compatible with metastases. Destructive lesions within the left iliac wing.   Electronically Signed   By: Rolm Baptise M.D.   On: 09/13/2013 14:57    EKG: Independently reviewed. NSR  Assessment/Plan Active Problems:   Hypercalcemia   Hypercalcemia of malignancy   Hypertension   Protein-calorie malnutrition, severe   Prostate cancer metastatic to multiple sites   1. Hypercalcemia -will be admitted for hydration -given calcitonin in the ED  -also was given aredia in the ED -will repeat calcium levels and adjust care appropriately  2. Hypertension -continue with antihypertensives -monitor blood pressures  3. Metastatic Prostate cancer -patient receiving estrogens every month -will monitor  4. Malnutrition -started on supplements -monitor IOs -will monitor weights -dietary consult  5. Transaminitis -likely related to his underlying malignancy -also noted on exam hepatomegaly with metastatic disease to the liver   Code Status: Full Code(must indicate code status--if unknown or must be presumed, indicate so) DVT Prophylaxis:on Eloquis Family Communication: Son in Room(indicate person spoken with, if applicable, with phone number if by telephone) Disposition Plan: SNF (indicate anticipated LOS)  Time spent: 8min  [name] [name] A [facility] Pager [phone]  **Disclaimer: This note may have been dictated with voice recognition software. Similar sounding words can inadvertently be transcribed and this note may contain transcription errors which may not have been corrected upon publication of note.**

## 2013-09-14 NOTE — ED Notes (Signed)
Initial contact- patient has limited verbal communication d/t fatigue. Per son-patient was admitted to hospital recently and discharged around 8/31 to [facility]. Has no upper dentures. Son has been trying to feed him ensure and soft foods until he is able to acquire upper plate. Says he has lost 10+ pounds in the last month. Per son "they are trying to get him to do physical and occupational therapy but he can't even stand. The nursing home sits his plate in front of him but he doesn't eat it and he's Muslim and he doesn't eat pork but they keep trying to give him pork. I just want a doctor to tell me if his metastatic prostate cancer is the reason and it's just taking its course or if I need to go the palliative route. If he can have an IV and some fluids I would prefer that to get him healthy again." Patient resting at this time. Social work called. Awaiting MD.

## 2013-09-15 DIAGNOSIS — C801 Malignant (primary) neoplasm, unspecified: Secondary | ICD-10-CM

## 2013-09-15 DIAGNOSIS — I82409 Acute embolism and thrombosis of unspecified deep veins of unspecified lower extremity: Secondary | ICD-10-CM

## 2013-09-15 DIAGNOSIS — C61 Malignant neoplasm of prostate: Secondary | ICD-10-CM

## 2013-09-15 DIAGNOSIS — E43 Unspecified severe protein-calorie malnutrition: Secondary | ICD-10-CM

## 2013-09-15 DIAGNOSIS — C800 Disseminated malignant neoplasm, unspecified: Secondary | ICD-10-CM

## 2013-09-15 LAB — HEMOGLOBIN A1C
Hgb A1c MFr Bld: 6.3 % — ABNORMAL HIGH (ref <5.7)
Mean Plasma Glucose: 134 mg/dL — ABNORMAL HIGH (ref <117)

## 2013-09-15 LAB — BASIC METABOLIC PANEL
Anion gap: 16 — ABNORMAL HIGH (ref 5–15)
BUN: 29 mg/dL — AB (ref 6–23)
CO2: 21 meq/L (ref 19–32)
Calcium: 13.8 mg/dL (ref 8.4–10.5)
Chloride: 102 mEq/L (ref 96–112)
Creatinine, Ser: 0.95 mg/dL (ref 0.50–1.35)
GFR calc non Af Amer: 82 mL/min — ABNORMAL LOW (ref >90)
Glucose, Bld: 71 mg/dL (ref 70–99)
POTASSIUM: 4.5 meq/L (ref 3.7–5.3)
Sodium: 139 mEq/L (ref 137–147)

## 2013-09-15 LAB — TSH: TSH: 1.04 u[IU]/mL (ref 0.350–4.500)

## 2013-09-15 MED ORDER — MORPHINE SULFATE (CONCENTRATE) 10 MG /0.5 ML PO SOLN
5.0000 mg | ORAL | Status: DC | PRN
  Administered 2013-09-15 – 2013-09-16 (×2): 5 mg via ORAL
  Filled 2013-09-15 (×2): qty 0.5

## 2013-09-15 MED ORDER — LORAZEPAM 0.5 MG PO TABS: 0.5000 mg | ORAL_TABLET | ORAL | Status: DC | PRN

## 2013-09-15 NOTE — Progress Notes (Signed)
Nutrition Brief Note  Patient identified on the Malnutrition Screening Tool (MST) Report  Wt Readings from Last 15 Encounters:  09/15/13 136 lb 6.4 oz (61.871 kg)  09/02/13 147 lb 4.3 oz (66.8 kg)  08/18/13 154 lb (69.854 kg)    Body mass index is 20.74 kg/(m^2). Patient meets criteria for Normal weight based on current BMI.   Current diet order is Regular, patient is consuming approximately 0% of meals at this time. Labs and medications reviewed.   Pt familiar to RD from previous admit in 08/2013; had been evaluated for poor PO intake and weight loss.   Pt's son reported continued decreased appetite and an intentional wt loss of 10 lbs in past 1-2 weeks (6.8% body weight, severe for time frame). Pt had minimal PO intake at SNF, would consume 1-2 Ensure Complete daily as main source of nutrients. Son provided feeding assistance and encouraged pt to eat; however pt declined.   Pt meets criteria for severe MALNUTRITION in the context of chronic illness as evidenced by 6.8% body weight loss in < one month, PO intake < 75% for > one month.  Son reported they are electing for hospice/pallitave services d/t worsening condition. Palliative care consult pending. Requested for pt to continue to receive Ensure Complete supplementation; however declined additional nutrition intervention or follow up  No additional nutrition interventions warranted at this time. If nutrition issues arise, please consult RD.   Atlee Abide MS RD LDN Clinical Dietitian [phone]

## 2013-09-15 NOTE — Progress Notes (Signed)
[facility] PROGRESS NOTE  [name] [name] YFV:[id number] DOB: 02/25/1940 DOA: 09/14/2013 PCP: Kandice Hams, MD  Assessment/Plan: 1. Prostate cancer: metastatic. -per family request and following patient wishes, no further treatment -patient is actively declining and barely eating. -will discontinue curative regimen and switch gears to comfort/quality approach -family in agreement and requesting it.  2. Hypercalcemia: secondary to dehydration and due to malignancy -after discussing with son; they will like to decline further invasive therapy -will continue just KVO fluids for now -starting PO roxanol and PRN ativan  3-HLD: no purpose of statins at this point; will discontinue  4-hx of DVT and hypercoagulable state: due to active gross hematuria and plans for comfort measures will stop eliquis and ASA  5-BPH: continue flomax  6-HTN: continue metoprolol (that will prevent rebound tachycardia)  *Palliaitve care has been consulted: will follow recommendations for symptomatic management and also with inputs regarding qualification to inpatient hospice vs home hospice or even palliative care at SNF. Family is available and open for discussion.   Code Status: DNR Family Communication: Son at bedside Disposition Plan: to be detrmine   Consultants:  Palliative care  Procedures:  None   Antibiotics:  None   HPI/Subjective: Patient is confused, afebrile and currently denying pain or SOB. Patient is barely eating and on exam has positive gross hematuria.  Objective: Filed Vitals:   09/15/13 1320  BP: 117/88  Pulse: 131  Temp: 98.4 F (36.9 C)  Resp:     Intake/Output Summary (Last 24 hours) at 09/15/13 1329 Last data filed at 09/15/13 0931  Gross per 24 hour  Intake    240 ml  Output    500 ml  Net   -260 ml   Filed Weights   09/14/13 2024 09/15/13 0540  Weight: 60.5 kg (133 lb 6.1 oz) 61.871 kg (136 lb 6.4 oz)    Exam:   General:  Afebrile,  pleasantly confused; denies pain or SOB currently  Cardiovascular:tachycardic, no rubs or gallops  Respiratory: good air movement, no wheezing  Abdomen: soft, NT, ND, positive BS  Musculoskeletal: no edema, no cyanosis   Data Reviewed: Basic Metabolic Panel:  Recent Labs Lab 09/14/13 1658 09/14/13 2247 09/15/13 0655  NA 138 137 139  K 4.6 4.5 4.5  CL 96 100 102  CO2 21 19 21   GLUCOSE 80 83 71  BUN 35* 31* 29*  CREATININE 1.26 1.09 0.95  CALCIUM >15.0* >15.0* 13.8*  MG 2.5  --   --   PHOS 3.1  --   --    Liver Function Tests:  Recent Labs Lab 09/14/13 1658 09/14/13 2247  AST 169* 166*  ALT 137* 120*  ALKPHOS 568* 483*  BILITOT 2.1* 1.8*  PROT 7.6 6.6  ALBUMIN 3.0* 2.4*   CBC:  Recent Labs Lab 09/14/13 1658  WBC 19.0*  HGB 11.8*  HCT 34.5*  MCV 83.9  PLT 397    Recent Results (from the past 240 hour(s))  MRSA PCR SCREENING     Status: None   Collection Time    09/14/13  8:27 PM      Result Value Ref Range Status   MRSA by PCR NEGATIVE  NEGATIVE Final   Comment:            The GeneXpert MRSA Assay (FDA     approved for NASAL specimens     only), is one component of a     comprehensive MRSA colonization     surveillance program. It is not  intended to diagnose MRSA     infection nor to guide or     monitor treatment for     MRSA infections.     Studies: Dg Chest 2 View  09/14/2013   CLINICAL DATA:  Weakness, weight loss, history hypertension, prostate cancer  EXAM: CHEST  2 VIEW  COMPARISON:  08/30/2013 ; CT abdomen and pelvis 09/13/2013  FINDINGS: Normal heart size and pulmonary vascularity.  Tortuous aorta with atherosclerotic calcification.  Minimal bibasilar atelectasis.  No definite infiltrate, pleural effusion or pneumothorax.  Nodular density projects over the inferior RIGHT lung laterally on the PA view, question pulmonary nodule versus sclerotic focus within the anterior RIGHT fifth rib.  Additional subtle nodular foci are seen within  additional bones suspicious for sclerotic osseous metastases.  IMPRESSION: Minimal bibasilar atelectasis.  Questionable sclerotic osseous metastases with additional sclerotic osseous metastasis versus a pulmonary nodule/metastasis at RIGHT lung base.   Electronically Signed   By: Lavonia Dana M.D.   On: 09/14/2013 17:33   Ct Abdomen Pelvis W Contrast  09/13/2013   CLINICAL DATA:  General abdominal pain. Weight loss, loss of appetite. History of prostate cancer with unknown metastases currently on hormone therapy.  EXAM: CT ABDOMEN AND PELVIS WITH CONTRAST  TECHNIQUE: Multidetector CT imaging of the abdomen and pelvis was performed using the standard protocol following bolus administration of intravenous contrast.  CONTRAST:  170mL OMNIPAQUE IOHEXOL 300 MG/ML  SOLN  COMPARISON:  None.  FINDINGS: Multiple pulmonary nodules within the lower lobes bilaterally. Index right lower lobe nodule on image 8 measures 8 mm. Numerous other smaller nodules in both lower lobes. Nodule also seen within the lingula anteriorly on image 6. No pleural effusions.  Innumerable low-density lesions throughout the liver. The largest is located centrally, measuring 7.5 x 5.6 cm on image 39.  Spleen, pancreas, adrenals are unremarkable. Benign appearing cyst in the midpole of the left kidney measures 2 cm. Right kidney unremarkable. No hydronephrosis.  Prostate is enlarged with central low density suggesting necrosis. Prostate measures 8.0 x 7.0 cm on image 83. Probable extracapsular extension. Multiple periprostatic soft tissue nodules and masses, the largest in the left posterior pelvis measuring 2.6 x 2.0 cm.  Bladder wall is diffusely thickened likely related to a degree of bladder outlet obstruction.  Stomach, large and small bowel are unremarkable. No free fluid or free air.  Innumerable sclerotic lesions throughout the visualized thoracic and lumbar spine, lower ribs, bony pelvis and proximal femurs. Destructive lesion noted within  the left iliac wing with adjacent soft tissue masses.  IMPRESSION: Enlarged prostate with central low density suggesting necrosis. Irregular margins and surrounding periprostatic soft tissue nodules compatible with extracapsular extension.  Innumerable lesions throughout the liver compatible with metastases.  Numerous bilateral lower lobe pulmonary nodule compatible with metastases.  Innumerable sclerotic lesions throughout the visualized bony structures compatible with metastases. Destructive lesions within the left iliac wing.   Electronically Signed   By: Rolm Baptise M.D.   On: 09/13/2013 14:57    Scheduled Meds: . docusate sodium  100 mg Oral BID  . feeding supplement (ENSURE COMPLETE)  237 mL Oral BID  . metoprolol tartrate  12.5 mg Oral BID  . sodium chloride  3 mL Intravenous Q12H  . tamsulosin  0.4 mg Oral QPM   Continuous Infusions: . sodium chloride      Active Problems:   Hypercalcemia   Hypercalcemia of malignancy   Hypertension   Protein-calorie malnutrition, severe   Prostate cancer metastatic to multiple  sites    Time spent: >30 minutes    Barton Dubois  [facility] Pager [phone]. If 7PM-7AM, please contact night-coverage at [email or web address], password [facility] 09/15/2013, 1:29 PM  LOS: 1 day

## 2013-09-15 NOTE — Progress Notes (Signed)
Assumed care of patient. No changes in initial am assessment. Cont with plan of care. Pt resting quietly in bed

## 2013-09-16 DIAGNOSIS — R5381 Other malaise: Secondary | ICD-10-CM

## 2013-09-16 DIAGNOSIS — Z515 Encounter for palliative care: Secondary | ICD-10-CM

## 2013-09-16 DIAGNOSIS — F0390 Unspecified dementia without behavioral disturbance: Secondary | ICD-10-CM

## 2013-09-16 DIAGNOSIS — R627 Adult failure to thrive: Secondary | ICD-10-CM

## 2013-09-16 DIAGNOSIS — R5383 Other fatigue: Secondary | ICD-10-CM

## 2013-09-16 DIAGNOSIS — R319 Hematuria, unspecified: Secondary | ICD-10-CM

## 2013-09-16 DIAGNOSIS — R531 Weakness: Secondary | ICD-10-CM

## 2013-09-16 DIAGNOSIS — G893 Neoplasm related pain (acute) (chronic): Secondary | ICD-10-CM

## 2013-09-16 MED ORDER — ACETAMINOPHEN 325 MG PO TABS: 650.0000 mg | ORAL_TABLET | Freq: Four times a day (QID) | ORAL | Status: AC | PRN

## 2013-09-16 MED ORDER — ACETAMINOPHEN 650 MG RE SUPP: 650.0000 mg | Freq: Four times a day (QID) | RECTAL | Status: AC | PRN

## 2013-09-16 MED ORDER — LORAZEPAM 1 MG PO TABS: 1.0000 mg | ORAL_TABLET | ORAL | Status: AC | PRN

## 2013-09-16 MED ORDER — LORAZEPAM 1 MG PO TABS: 1.0000 mg | ORAL_TABLET | ORAL | Status: DC | PRN

## 2013-09-16 MED ORDER — MORPHINE SULFATE (CONCENTRATE) 10 MG /0.5 ML PO SOLN
5.0000 mg | ORAL | Status: DC | PRN
  Administered 2013-09-16 (×2): 5 mg via ORAL
  Filled 2013-09-16 (×2): qty 0.5

## 2013-09-16 MED ORDER — METOPROLOL TARTRATE 50 MG PO TABS: 50.0000 mg | ORAL_TABLET | Freq: Two times a day (BID) | ORAL | Status: AC

## 2013-09-16 MED ORDER — BISACODYL 10 MG RE SUPP: 10.0000 mg | Freq: Every day | RECTAL | Status: AC | PRN

## 2013-09-16 MED ORDER — MORPHINE SULFATE (CONCENTRATE) 10 MG /0.5 ML PO SOLN: 5.0000 mg | ORAL | Status: AC | PRN

## 2013-09-16 NOTE — Consult Note (Signed)
[facility] Liaison: [facility] room available for Mr. Shavers today. Family completed transfer paper work. Please fax DC summary to [phone] and have RN call report to [phone]. Please arrange transport for patient to arrives as early as possible. Thank you. Erling Conte LCSW [phone]

## 2013-09-16 NOTE — Clinical Social Work Note (Signed)
CSW met with son, daughter and other family members this morning- [place] referral made their  request- they are familiar with the setting and like this option for dad- Erling Conte, [place] was able to meet with family, review chart and offer bed for today- family quite pleased to know this and are comforted by the location, services and rapport with Erling Conte- [facility] transport arranged for transfer- support provided as well as questions answered-  Eduard Clos, MSW, [place]

## 2013-09-16 NOTE — Discharge Summary (Signed)
Physician Discharge Summary  [name] [name] TDD:[id number] DOB: [birth year] DOA: 09/14/2013  PCP: Kandice Hams, MD  Admit date: 09/14/2013 Discharge date: 09/16/2013  Time spent: >30 minutes  Recommendations for Outpatient Follow-up:  Full Comfort care Please adjust medications as needed and titrate morphine/ativan to achieve desire level of comfort Comfort feeding Discharge with foley catheter for skin integrity and comfort.  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 Will discharge to [facility] for comfort Care.  Diet recommendation: comfort feeding.  Filed Weights   09/14/13 2024 09/15/13 0540 09/16/13 0516  Weight: 60.5 kg (133 lb 6.1 oz) 61.871 kg (136 lb 6.4 oz) 62.46 kg (137 lb 11.2 oz)    History of present illness:  73 y.o. male recently admitted with hypercalcemia presents back to the ED with similar complaints. Patient was discharged after treatment for the same. Patients son states that he has been having a decline in his status over the course of his stay at a SNF. He states he has had poor intake he has not been drinking well. He has not been very active. Patient has noted increased weakness. Also has had a CT scan done which did shows mets to the liver. Patient was evaluated in the ED with a calcium greater than 15 and was started on IV fluids as well as calcitonin dose was given along with [facility] Course:  1. Prostate cancer: metastatic.  -per family request and following patient wishes, no further treatment  -patient is actively declining and barely having any PO intake.  -will discontinue curative regimen and switch gears to comfort/quality approach   2.  Hypercalcemia: secondary to dehydration and due to malignancy  -after discussing with son; they will like to decline further invasive therapy  -will focus on comfort measures and will transfer to [facility] to achieve this [place] -Discharge on PO roxanol and PRN ativan   3-HLD: no purpose of statins at this point; will discontinue   4-hx of DVT and hypercoagulable state: due to active gross hematuria and plans for comfort measures will stop eliquis and ASA at this point  5-BPH: continue flomax and to assist with urination will place foley cantheter  6-HTN: continue metoprolol (that will prevent rebound tachycardia as well)  7-dementia w/o behavioral disturbances: comfort care measures and supportive care.   Procedures:  See below for x-ray reports   Consultations:  Palliative Care service  Discharge Exam: Filed Vitals:   09/16/13 0516  BP: 130/75  Pulse: 120  Temp: 97.7 F (36.5 C)  Resp: 24   General: Afebrile, confused and barely taking any PO's. Patient denies SOB and complaints of dysuria Cardiovascular:tachycardic, no rubs or gallops  Respiratory: no Crackles, no wheezing  Abdomen: soft, ND, positive BS  Musculoskeletal: no edema, no cyanosis    Discharge Instructions You were cared for by a hospitalist during your hospital stay. If you have any questions about your discharge medications or the care you received while you were in the hospital after you are discharged, you can call the unit and asked to speak with the hospitalist on call if the hospitalist that took care of you is not available. Once you are discharged, your primary care physician will handle any further medical issues. Please  note that NO REFILLS for any discharge medications will be authorized once you are discharged, as it is imperative that you return to your primary care physician (or establish a relationship with a primary care physician if you do not have one) for your aftercare needs so that they  can reassess your need for medications and monitor your lab values.  Discharge Instructions   Discharge instructions    Complete by:  As directed   Comfort care Adjust medications as needed and titrate morphine/ativan as needed to achieved comfort Comfort feeding Discharge with foley catheter for skin integrity and comfort.            Medication List    STOP taking these medications       atorvastatin 10 MG tablet  Commonly known as:  LIPITOR     ELIQUIS 5 MG Tabs tablet  Generic drug:  apixaban     ESTROGENS CONJUGATED IJ     potassium & sodium phosphates 280-160-250 MG Pack  Commonly known as:  PHOS-NAK      TAKE these medications       acetaminophen 325 MG tablet  Commonly known as:  TYLENOL  Take 2 tablets (650 mg total) by mouth every 6 (six) hours as needed for mild pain (or Fever >/= 101).     acetaminophen 650 MG suppository  Commonly known as:  TYLENOL  Place 1 suppository (650 mg total) rectally every 6 (six) hours as needed for mild pain (or Fever >/= 101).     bisacodyl 10 MG suppository  Commonly known as:  DULCOLAX  Place 1 suppository (10 mg total) rectally daily as needed for moderate constipation.     feeding supplement Liqd  Take 237 mLs by mouth 2 (two) times daily.     LORazepam 1 MG tablet  Commonly known as:  ATIVAN  Take 1 tablet (1 mg total) by mouth every 4 (four) hours as needed for anxiety.     metoprolol 50 MG tablet  Commonly known as:  LOPRESSOR  Take 1 tablet (50 mg total) by mouth 2 (two) times daily.     morphine CONCENTRATE 10 mg / 0.5 ml concentrated solution  Take 0.25 mLs (5 mg total) by mouth every hour as needed for moderate pain or shortness of breath.     tamsulosin 0.4 MG Caps capsule  Commonly known as:  FLOMAX  Take 0.4 mg by mouth every evening.       Allergies  Allergen Reactions  . Ace Inhibitors Cough  . Namenda [Memantine Hcl] Other (See Comments)    Confusion     The results of significant  diagnostics from this hospitalization (including imaging, microbiology, ancillary and laboratory) are listed below for reference.    Significant Diagnostic Studies: Dg Chest 2 View  09/14/2013   CLINICAL DATA:  Weakness, weight loss, history hypertension, prostate cancer  EXAM: CHEST  2 VIEW  COMPARISON:  08/30/2013 ; CT abdomen and pelvis 09/13/2013  FINDINGS: Normal heart size and pulmonary vascularity.  Tortuous aorta with atherosclerotic calcification.  Minimal bibasilar atelectasis.  No definite infiltrate, pleural effusion or pneumothorax.  Nodular density projects over the inferior RIGHT lung laterally on the PA view, question pulmonary nodule versus sclerotic focus within the anterior RIGHT fifth rib.  Additional subtle nodular foci are seen within additional bones suspicious for sclerotic osseous metastases.  IMPRESSION: Minimal bibasilar atelectasis.  Questionable sclerotic osseous metastases with additional sclerotic osseous metastasis versus a pulmonary nodule/metastasis at RIGHT lung base.  Electronically Signed   By: Lavonia Dana M.D.   On: 09/14/2013 17:33   Dg Chest 2 View  08/30/2013   CLINICAL DATA:  Prostate cancer.  Weakness.  EXAM: CHEST  2 VIEW  COMPARISON:  None.  FINDINGS: The lungs are clear. Heart size is normal. There is no pneumothorax or effusion. No focal bony abnormality is identified.  IMPRESSION: No acute disease.   Electronically Signed   By: Inge Rise M.D.   On: 08/30/2013 20:26   Ct Head Wo Contrast  08/30/2013   CLINICAL DATA:  Confusion, slurred speech  EXAM: CT HEAD WITHOUT CONTRAST  TECHNIQUE: Contiguous axial images were obtained from the base of the skull through the vertex without intravenous contrast.  COMPARISON:  None.  FINDINGS: No skull fracture is noted. Paranasal sinuses and mastoid air cells are unremarkable.  No intracranial hemorrhage, mass effect or midline shift. Moderate cerebral atrophy. No acute cortical infarction. No mass lesion is noted on  this unenhanced scan.  IMPRESSION: No acute intracranial abnormality. Moderate cerebral atrophy. No definite acute cortical infarction.   Electronically Signed   By: Lahoma Crocker M.D.   On: 08/30/2013 20:32   Mr Jeri Cos FA Contrast  09/02/2013   CLINICAL DATA:  Metastatic prostate cancer. Confusion and difficulty speaking.  EXAM: MRI HEAD WITHOUT AND WITH CONTRAST  TECHNIQUE: Multiplanar, multiecho pulse sequences of the brain and surrounding structures were obtained without and with intravenous contrast.  CONTRAST:  89mL MULTIHANCE GADOBENATE DIMEGLUMINE 529 MG/ML IV SOLN  COMPARISON:  CT head 08/30/2013  FINDINGS: Age-appropriate atrophy. Negative for hydrocephalus. Pituitary normal in size.  Negative for acute infarct. Minimal chronic microvascular ischemic change in the white matter. Minimal chronic microvascular ischemia in the pons. No cortical infarction.  Negative for hemorrhage.  No mass lesion.  Postcontrast imaging reveals no enhancing mass lesion in the brain.  The calvarium is intact without evidence of bony metastatic disease.  Some of the images are degraded by motion.  IMPRESSION: No acute abnormality. Negative for acute infarct. Negative for metastatic disease. No cause for the patient's symptoms.   Electronically Signed   By: Franchot Gallo M.D.   On: 09/02/2013 12:04   Ct Abdomen Pelvis W Contrast  09/13/2013   CLINICAL DATA:  General abdominal pain. Weight loss, loss of appetite. History of prostate cancer with unknown metastases currently on hormone therapy.  EXAM: CT ABDOMEN AND PELVIS WITH CONTRAST  TECHNIQUE: Multidetector CT imaging of the abdomen and pelvis was performed using the standard protocol following bolus administration of intravenous contrast.  CONTRAST:  114mL OMNIPAQUE IOHEXOL 300 MG/ML  SOLN  COMPARISON:  None.  FINDINGS: Multiple pulmonary nodules within the lower lobes bilaterally. Index right lower lobe nodule on image 8 measures 8 mm. Numerous other smaller nodules in  both lower lobes. Nodule also seen within the lingula anteriorly on image 6. No pleural effusions.  Innumerable low-density lesions throughout the liver. The largest is located centrally, measuring 7.5 x 5.6 cm on image 39.  Spleen, pancreas, adrenals are unremarkable. Benign appearing cyst in the midpole of the left kidney measures 2 cm. Right kidney unremarkable. No hydronephrosis.  Prostate is enlarged with central low density suggesting necrosis. Prostate measures 8.0 x 7.0 cm on image 83. Probable extracapsular extension. Multiple periprostatic soft tissue nodules and masses, the largest in the left posterior pelvis measuring 2.6 x 2.0 cm.  Bladder wall is diffusely thickened likely related to a degree of bladder outlet obstruction.  Stomach, large and small bowel  are unremarkable. No free fluid or free air.  Innumerable sclerotic lesions throughout the visualized thoracic and lumbar spine, lower ribs, bony pelvis and proximal femurs. Destructive lesion noted within the left iliac wing with adjacent soft tissue masses.  IMPRESSION: Enlarged prostate with central low density suggesting necrosis. Irregular margins and surrounding periprostatic soft tissue nodules compatible with extracapsular extension.  Innumerable lesions throughout the liver compatible with metastases.  Numerous bilateral lower lobe pulmonary nodule compatible with metastases.  Innumerable sclerotic lesions throughout the visualized bony structures compatible with metastases. Destructive lesions within the left iliac wing.   Electronically Signed   By: Rolm Baptise M.D.   On: 09/13/2013 14:57    Microbiology: Recent Results (from the past 240 hour(s))  MRSA PCR SCREENING     Status: None   Collection Time    09/14/13  8:27 PM      Result Value Ref Range Status   MRSA by PCR NEGATIVE  NEGATIVE Final   Comment:            The GeneXpert MRSA Assay (FDA     approved for NASAL specimens     only), is one component of a      comprehensive MRSA colonization     surveillance program. It is not     intended to diagnose MRSA     infection nor to guide or     monitor treatment for     MRSA infections.     Labs: Basic Metabolic Panel:  Recent Labs Lab 09/14/13 1658 09/14/13 2247 09/15/13 0655  NA 138 137 139  K 4.6 4.5 4.5  CL 96 100 102  CO2 21 19 21   GLUCOSE 80 83 71  BUN 35* 31* 29*  CREATININE 1.26 1.09 0.95  CALCIUM >15.0* >15.0* 13.8*  MG 2.5  --   --   PHOS 3.1  --   --    Liver Function Tests:  Recent Labs Lab 09/14/13 1658 09/14/13 2247  AST 169* 166*  ALT 137* 120*  ALKPHOS 568* 483*  BILITOT 2.1* 1.8*  PROT 7.6 6.6  ALBUMIN 3.0* 2.4*   CBC:  Recent Labs Lab 09/14/13 1658  WBC 19.0*  HGB 11.8*  HCT 34.5*  MCV 83.9  PLT 397    Signed:  Barton Dubois  [facility] 09/16/2013, 1:10 PM

## 2013-09-16 NOTE — Progress Notes (Signed)
Pt had 2 episodes of labored breathing and moaning in which he received morphine concentrate. Pt then relaxed, RR was controlled, and patient returned to sleep. Will continue to monitor.

## 2013-09-16 NOTE — Consult Note (Signed)
Patient HQ:[name]      DOB: [birth year]      MWU:[id number]     Consult Note from the Palliative Medicine Team at [place] Requested by: Dr Dyann Kief     PCP: Kandice Hams, MD Reason for Consultation:Clarifiation of [place] and otpions     Phone [phone]  Assessment of patients Current state:   Continued physical, functional and cognitive decline 2/2 to metastatic prostate cancer.  Prognosis is likely days.  Focus of care is comfort, hopeful for residential hospice facility  Consult is for review of medical treatment options, clarification of goals of care and end of life issues, disposition and options, and symptom recommendation.  This NP Wadie Lessen reviewed medical records, received report from team, assessed the patient and then meet at the patient's bedside along with his family to include his son ([name] [name]) and daughter and several other family members   to discuss diagnosis prognosis, [place], EOL wishes disposition and options.  A detailed discussion was had today regarding advanced directives.  Concepts specific to code status, artifical feeding and hydration, continued IV antibiotics and rehospitalization was had.  The difference between a aggressive medical intervention path  and a palliative comfort care path for this patient at this time was had.  Values and goals of care important to patient and family were attempted to be elicited.  Concept of Hospice and Palliative Care were discussed  Natural trajectory and expectations at EOL were discussed.  Questions and concerns addressed.  Hard Choices booklet left for review. Family encouraged to call with questions or concerns.  PMT will continue to support holistically.   Goals of Care: 1.  Code Status:DNR/DNI-comfort is main focus of care   2. Scope of Treatment:  1. Vital Signs:daily  2. Respiratory/Oxygen:as needed for comfort 3. Nutritional Support/Tube Feeds:no artifical feeding now or  in the future 4. Antibiotics:none 5. Blood Products:none 6. IVF:KVO for medciations 7. Review of Medications to be discontinued:minimize for comfort 8. Labs:none 9. Telemetry:none  3. Disposition:  Hopeful for residential hospice facility, will write for choice    4. Symptom Management:   1. Anxiety/Agitation: Ativan 1 mg po/sl every 4 hrs prn 2. Pain/Dyspnea: Roxanol 5 mg po/sl every 1 hr prn  3. Bowel Regimen:Dulcolax supp prn  5. Psychosocial:  Emotional support offered to family at bedside, all support a comfort path.  This anticipatory grief is compounded by the loss of their mother less that 2 months ago.  6. Spiritual:  Patient is Muslim, his son has already prepared for his burial in [place].       Patient Documents Completed or Given: Document Given Completed  Advanced Directives Pkt    MOST    DNR    Gone from My Sight    Hard Choices yes     Brief HPI: [name] [name] is a 73 y.o. male recently admitted with hypercalcemia presents back to the ED with similar complaints. Patients son reports that he has been having a decline in his status over the course of his stay at a SNF. He states he has had poor intake he has not been drinking well. He has not been very active. Patient has noted increased weakness. CT scan done which did shows mets to the liver.   Continued failure to thrive, prognosis is likely days.  ROS: unable to illicit due to decreased cognition   PMH:  Past Medical History  Diagnosis Date  . Hypertension   . Dementia   .  Cancer     prostate, spine ribs, left pelvis, and skull  . DVT (deep venous thrombosis)      PSH: Past Surgical History  Procedure Laterality Date  . No past surgeries     I have reviewed the [place] and SH and  If appropriate update it with new information. Allergies  Allergen Reactions  . Ace Inhibitors Cough  . Namenda [Memantine Hcl] Other (See Comments)    Confusion    Scheduled Meds: . docusate sodium  100 mg  Oral BID  . feeding supplement (ENSURE COMPLETE)  237 mL Oral BID  . metoprolol tartrate  12.5 mg Oral BID  . sodium chloride  3 mL Intravenous Q12H  . tamsulosin  0.4 mg Oral QPM   Continuous Infusions: . sodium chloride 100 mL/hr at 09/16/13 0350   PRN Meds:.acetaminophen, acetaminophen, alum & mag hydroxide-simeth, bisacodyl, LORazepam, morphine CONCENTRATE, ondansetron (ZOFRAN) IV, ondansetron, zolpidem    BP 130/75  Pulse 120  Temp(Src) 97.7 F (36.5 C) (Oral)  Resp 24  Ht 5\' 8"  (1.727 m)  Wt 62.46 kg (137 lb 11.2 oz)  BMI 20.94 kg/m2  SpO2 99%   PPS:30 % at best   Intake/Output Summary (Last 24 hours) at 09/16/13 0859 Last data filed at 09/16/13 0851  Gross per 24 hour  Intake 1458.33 ml  Output   2425 ml  Net -966.67 ml     Physical Exam  General: ill appearing, NAD, lethargic HEENT:  moist buccal membranes, audible throat secretions Chest:  Decreased in bases CVS: tachycardic Abdomen:soft NT +BS Ext: without edema Neuro:moves all four extremities, arouses to name, lethargic  Labs: CBC    Component Value Date/Time   WBC 19.0* 09/14/2013 1658   RBC 4.11* 09/14/2013 1658   HGB 11.8* 09/14/2013 1658   HCT 34.5* 09/14/2013 1658   PLT 397 09/14/2013 1658   MCV 83.9 09/14/2013 1658   MCH 28.7 09/14/2013 1658   MCHC 34.2 09/14/2013 1658   RDW 14.2 09/14/2013 1658   LYMPHSABS 1.2 08/31/2013 0447   MONOABS 2.1* 08/31/2013 0447   EOSABS 0.0 08/31/2013 0447   BASOSABS 0.0 08/31/2013 0447    BMET    Component Value Date/Time   NA 139 09/15/2013 0655   K 4.5 09/15/2013 0655   CL 102 09/15/2013 0655   CO2 21 09/15/2013 0655   GLUCOSE 71 09/15/2013 0655   BUN 29* 09/15/2013 0655   CREATININE 0.95 09/15/2013 0655   CALCIUM 13.8* 09/15/2013 0655   CALCIUM 11.4* 09/01/2013 1830   GFRNONAA 82* 09/15/2013 0655   GFRAA >90 09/15/2013 0655    CMP     Component Value Date/Time   NA 139 09/15/2013 0655   K 4.5 09/15/2013 0655   CL 102 09/15/2013 0655   CO2 21 09/15/2013 0655    GLUCOSE 71 09/15/2013 0655   BUN 29* 09/15/2013 0655   CREATININE 0.95 09/15/2013 0655   CALCIUM 13.8* 09/15/2013 0655   CALCIUM 11.4* 09/01/2013 1830   PROT 6.6 09/14/2013 2247   ALBUMIN 2.4* 09/14/2013 2247   AST 166* 09/14/2013 2247   ALT 120* 09/14/2013 2247   ALKPHOS 483* 09/14/2013 2247   BILITOT 1.8* 09/14/2013 2247   GFRNONAA 82* 09/15/2013 0655   GFRAA >90 09/15/2013 0655    CT abdomen 09-13-13  Enlarged prostate with central low density suggesting necrosis.  Irregular margins and surrounding periprostatic soft tissue nodules  compatible with extracapsular extension.  Innumerable lesions throughout the liver compatible with metastases.  Numerous bilateral lower lobe pulmonary  nodule compatible with  metastases.  Innumerable sclerotic lesions throughout the visualized bony  structures compatible with metastases. Destructive lesions within  the left iliac wing.    Time In Time Out Total Time Spent with Patient Total Overall Time  0815 0930 70 min 75 min    Greater than 50%  of this time was spent counseling and coordinating care related to the above assessment and plan.   Wadie Lessen NP  Palliative Medicine Team Team Phone # [phone] Pager [phone]  Discussed with Dr Dyann Kief

## 2013-09-16 NOTE — Progress Notes (Signed)
Family at bedside. Son insisting on having family "stop by to say their good-byes"

## 2013-09-16 NOTE — Progress Notes (Signed)
Called report to [facility] at [phone] and [facility] arrived to transport pt at 1417. Family at bedside.

## 2013-09-16 NOTE — Progress Notes (Signed)
Pt was in bed resting when I arrived. Pt's daughter, son, nieces were bedside. Daughter explained that their mom passed June 23. She found comfort in Scriptures (book of Job). I listened as they talked about their faith and how they are ok even now. Had prayer w/pt's children and nieces away from bedside (pt is muslim). Pt's family was very appreciative of prayer and were appropropriately tearful.  Ernest Haber Chaplain  09/16/13 1200  Clinical Encounter Type  Visited With Family

## 2013-10-05 NOTE — Consult Note (Signed)
I have reviewed and discussed the care of this patient in detail with the nurse practitioner including pertinent patient records, physical exam findings and data. I agree with details of this encounter.  

## 2013-10-05 DEATH — deceased

## 2013-12-22 ENCOUNTER — Encounter: Payer: Self-pay | Admitting: Neurology

## 2013-12-28 ENCOUNTER — Encounter: Payer: Self-pay | Admitting: Neurology

## 2015-12-01 IMAGING — CT CT HEAD W/O CM
2 series · 16 of 30 positions shown, 20 images · non-contrast
Comparison: None.

CLINICAL DATA: Confusion, slurred speech

EXAM:
CT HEAD WITHOUT CONTRAST
TECHNIQUE: Contiguous axial images were obtained from the base of the skull
through the vertex without intravenous contrast.

[Series 2: head w/o · axial · non-contrast · 0.46mm/px · z∈[-215,-85]mm · 13 of 32 slices shown, 17 images]
[im 3/32  brain]
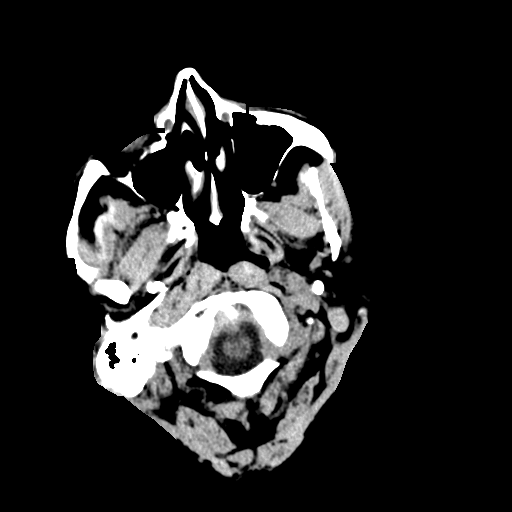
[im 3/32  bone]
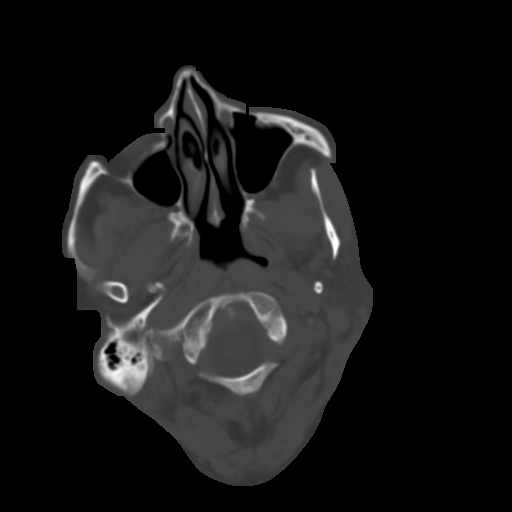
[im 5/32  brain]
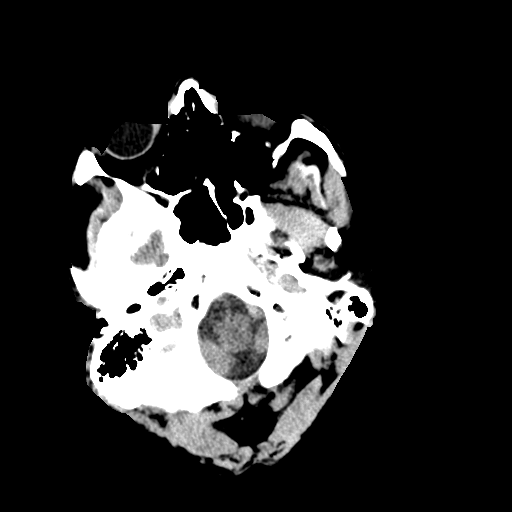
[im 7/32  brain]
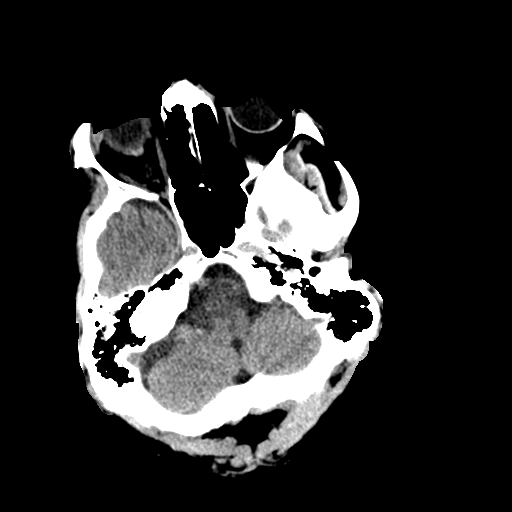
[im 9/32  brain]
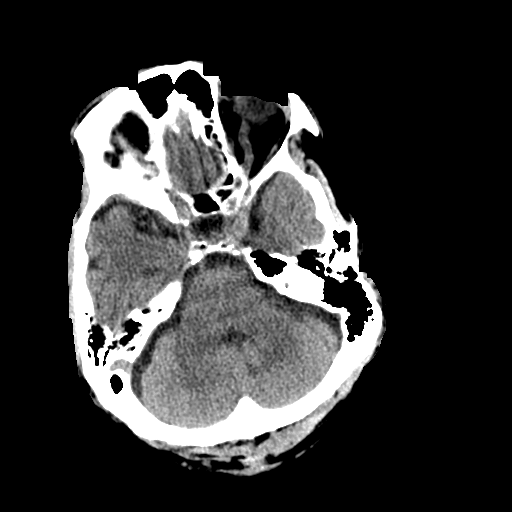
[im 12/32  brain]
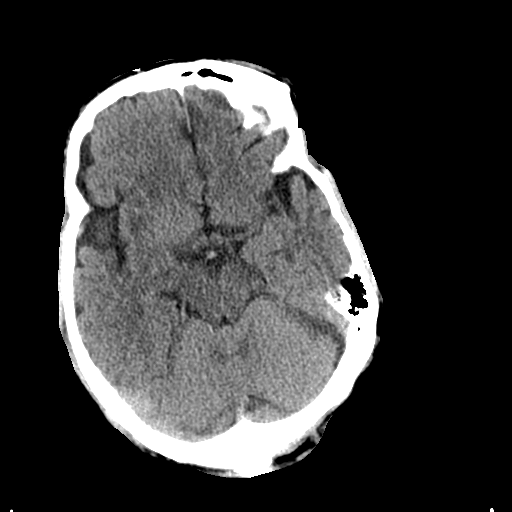
[im 12/32  bone]
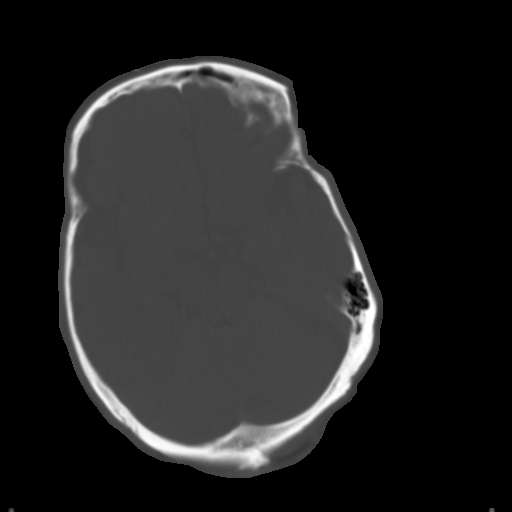
[im 14/32  brain]
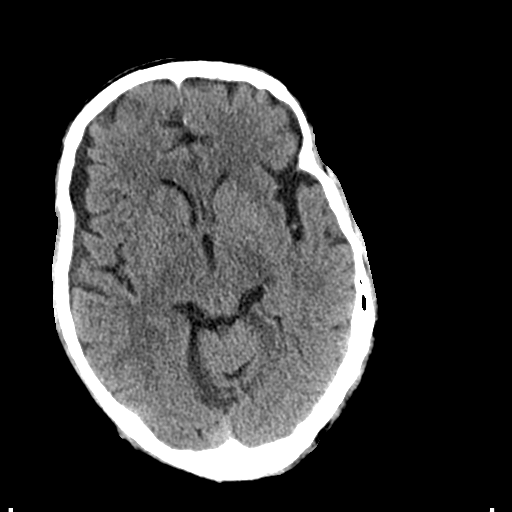
[im 16/32  brain]
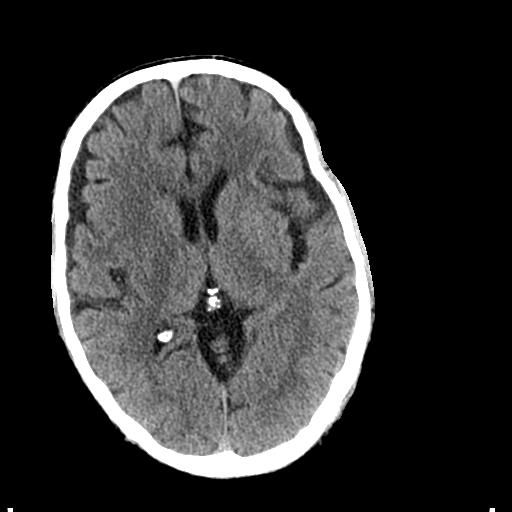
[im 18/32  brain]
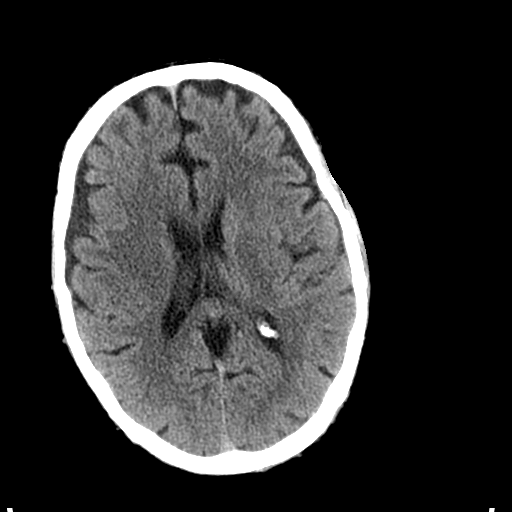
[im 20/32  brain]
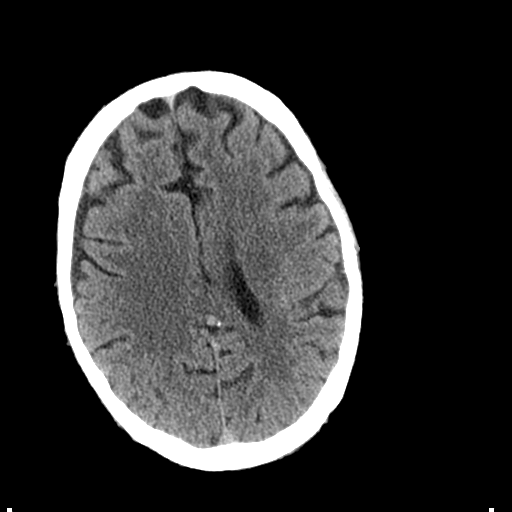
[im 20/32  bone]
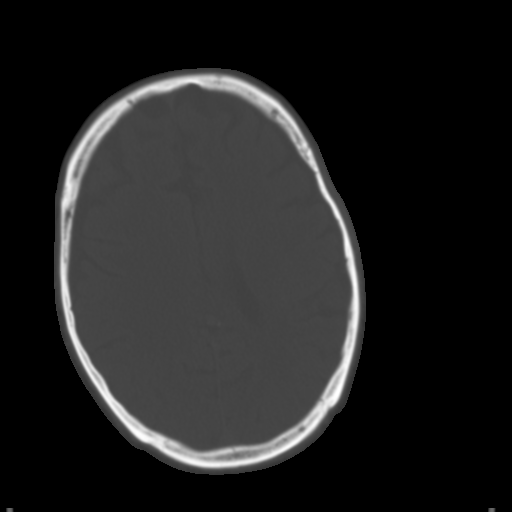
[im 23/32  brain]
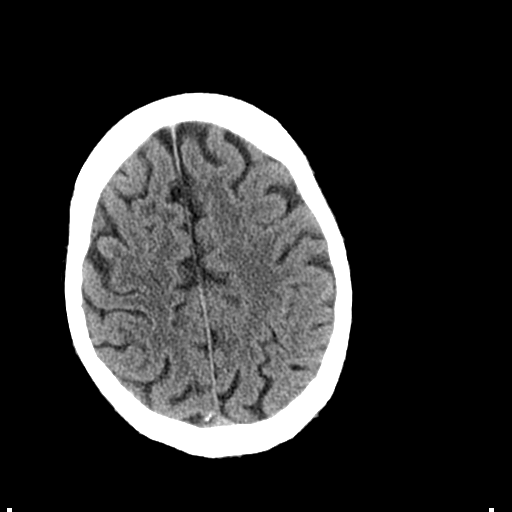
[im 25/32  brain]
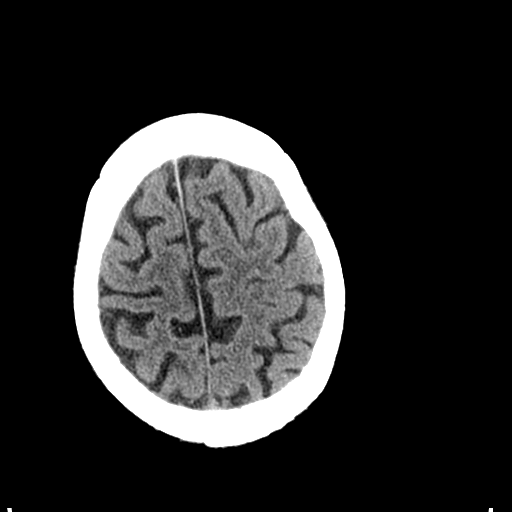
[im 27/32  brain]
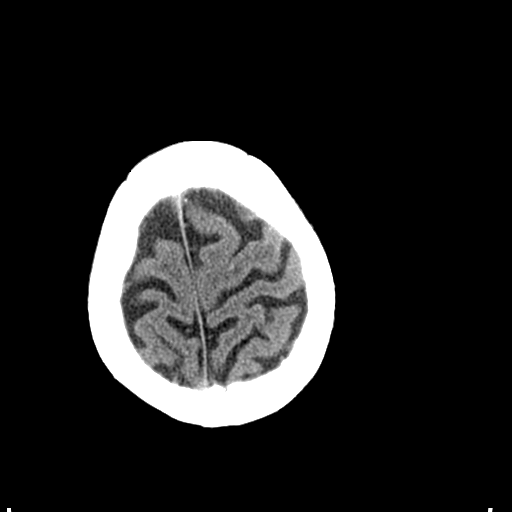
[im 29/32  brain]
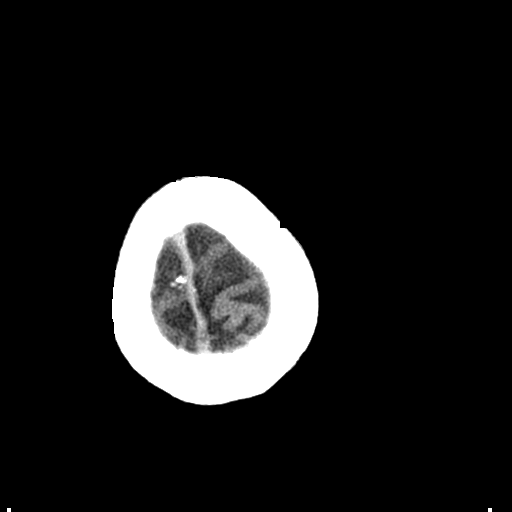
[im 29/32  bone]
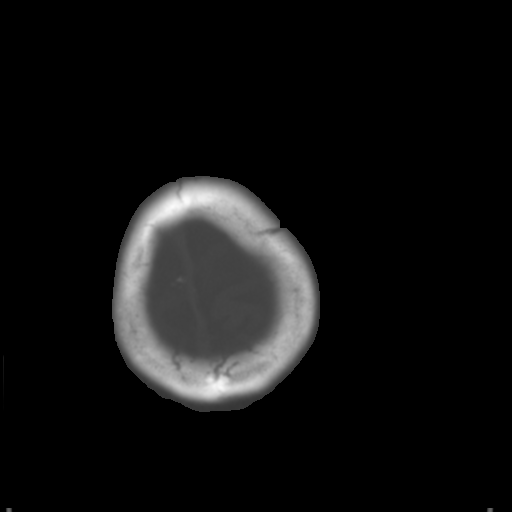

[Series 3: bone windows · axial · 0.46mm/px · z∈[-215,-170]mm · 3 of 32 slices shown]
[im 3/32  bone]
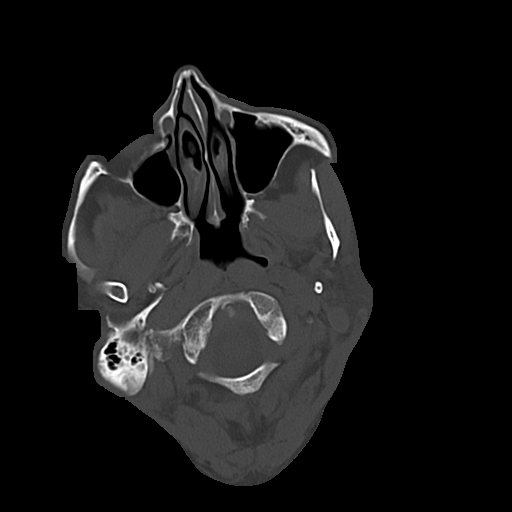
[im 7/32  bone]
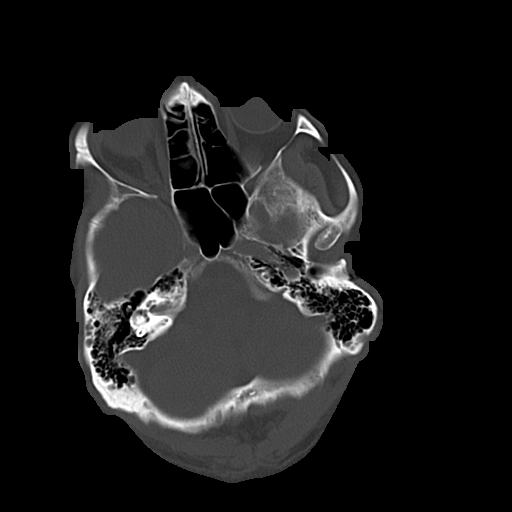
[im 12/32  bone]
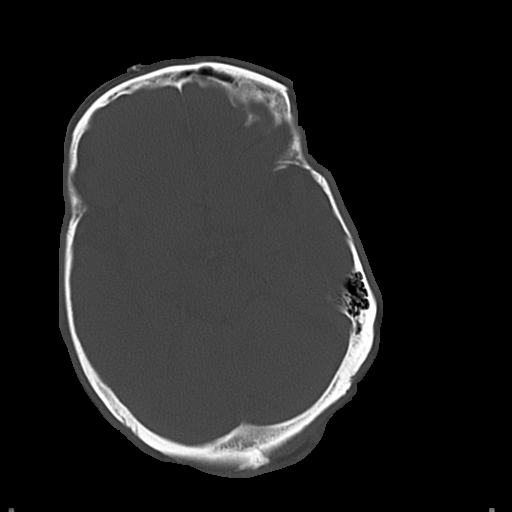

[16 of 30 positions shown; findings below may reference images not displayed]

FINDINGS: No skull fracture is noted. Paranasal sinuses and mastoid air cells
are unremarkable.

No intracranial hemorrhage, mass effect or midline shift. Moderate
cerebral atrophy. No acute cortical infarction. No mass lesion is
noted on this unenhanced scan.
IMPRESSION: No acute intracranial abnormality. Moderate cerebral atrophy. No
definite acute cortical infarction.

## 2015-12-01 IMAGING — CR DG CHEST 2V
2 series · 2 of 2 positions shown · non-contrast
Comparison: None.

CLINICAL DATA: Prostate cancer.  Weakness.

EXAM:
CHEST  2 VIEW

[w chest lat (1 of 2)]
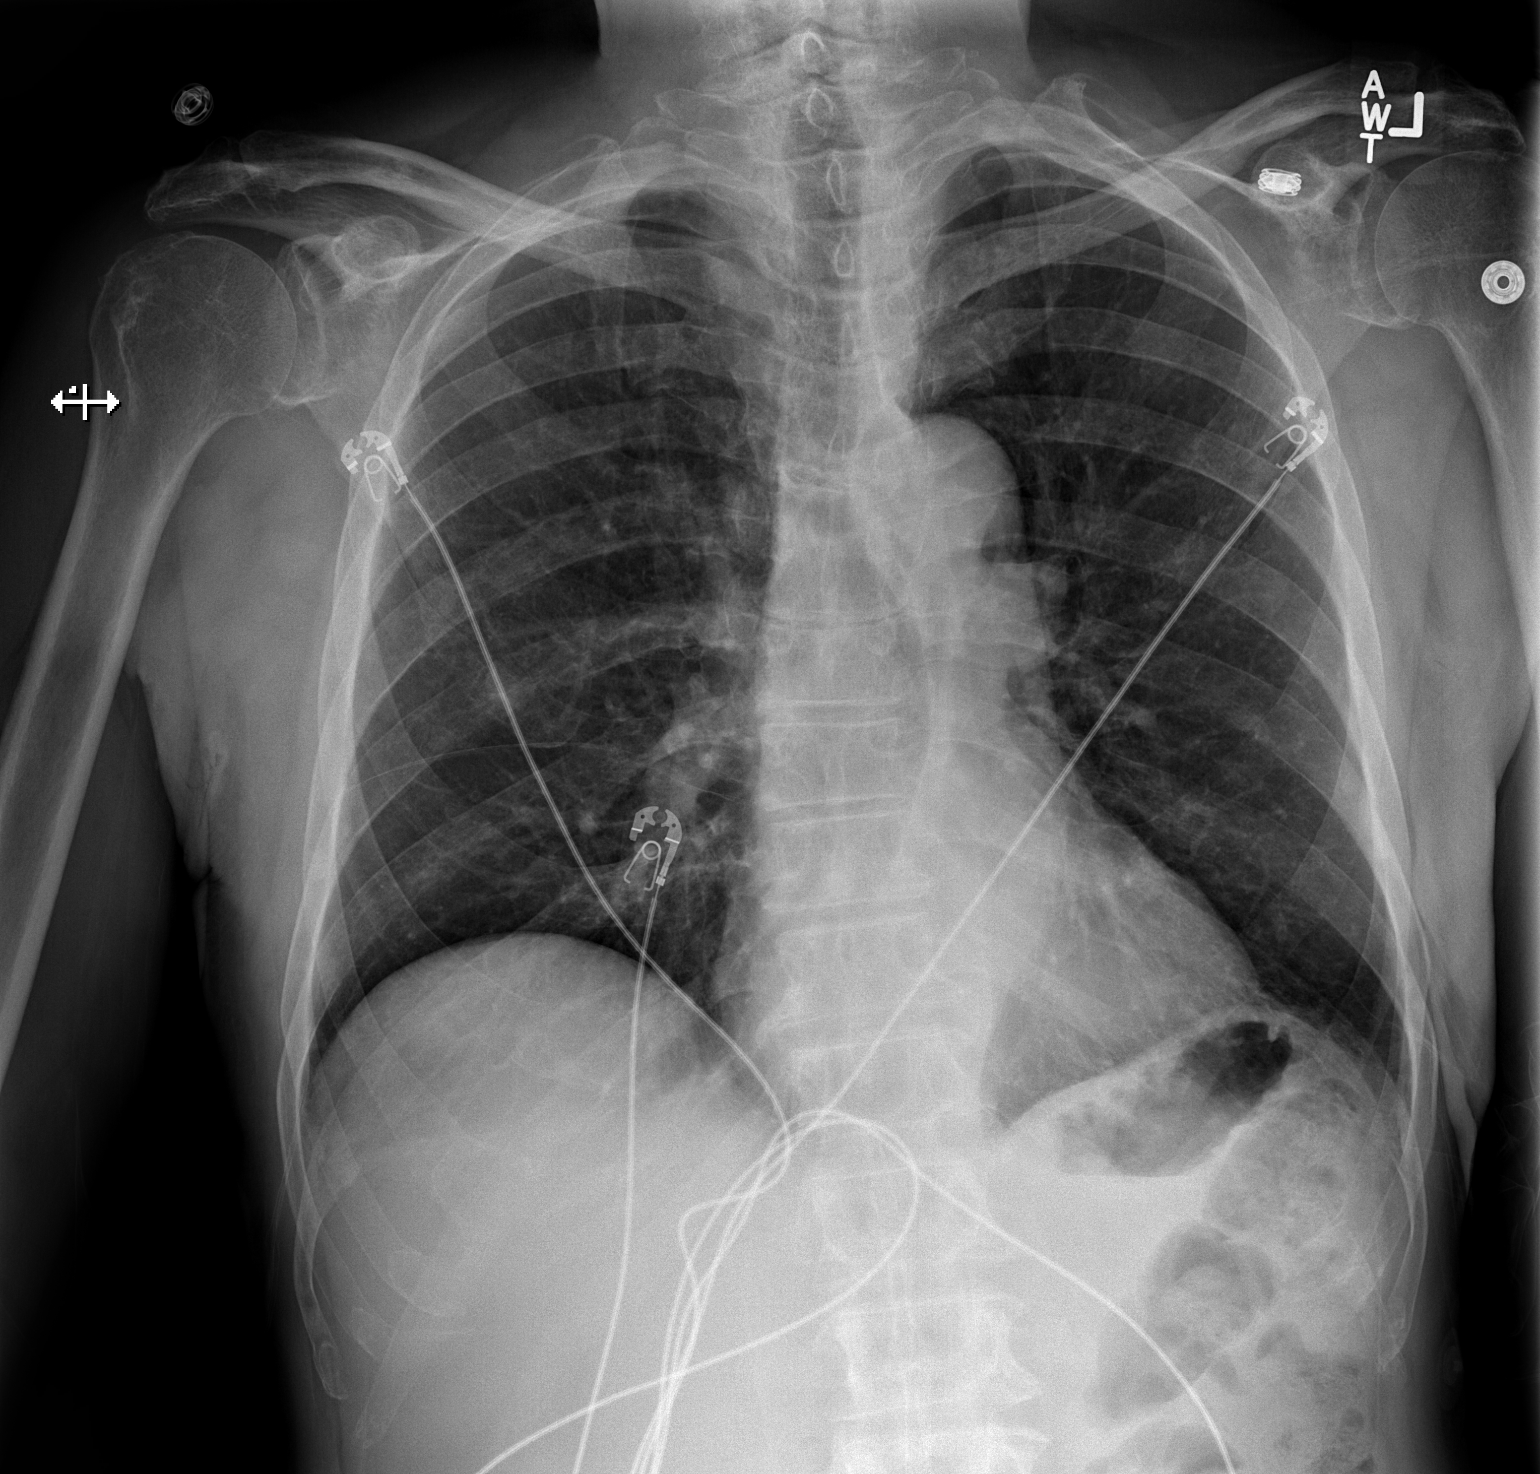

[w chest lat (2 of 2)]
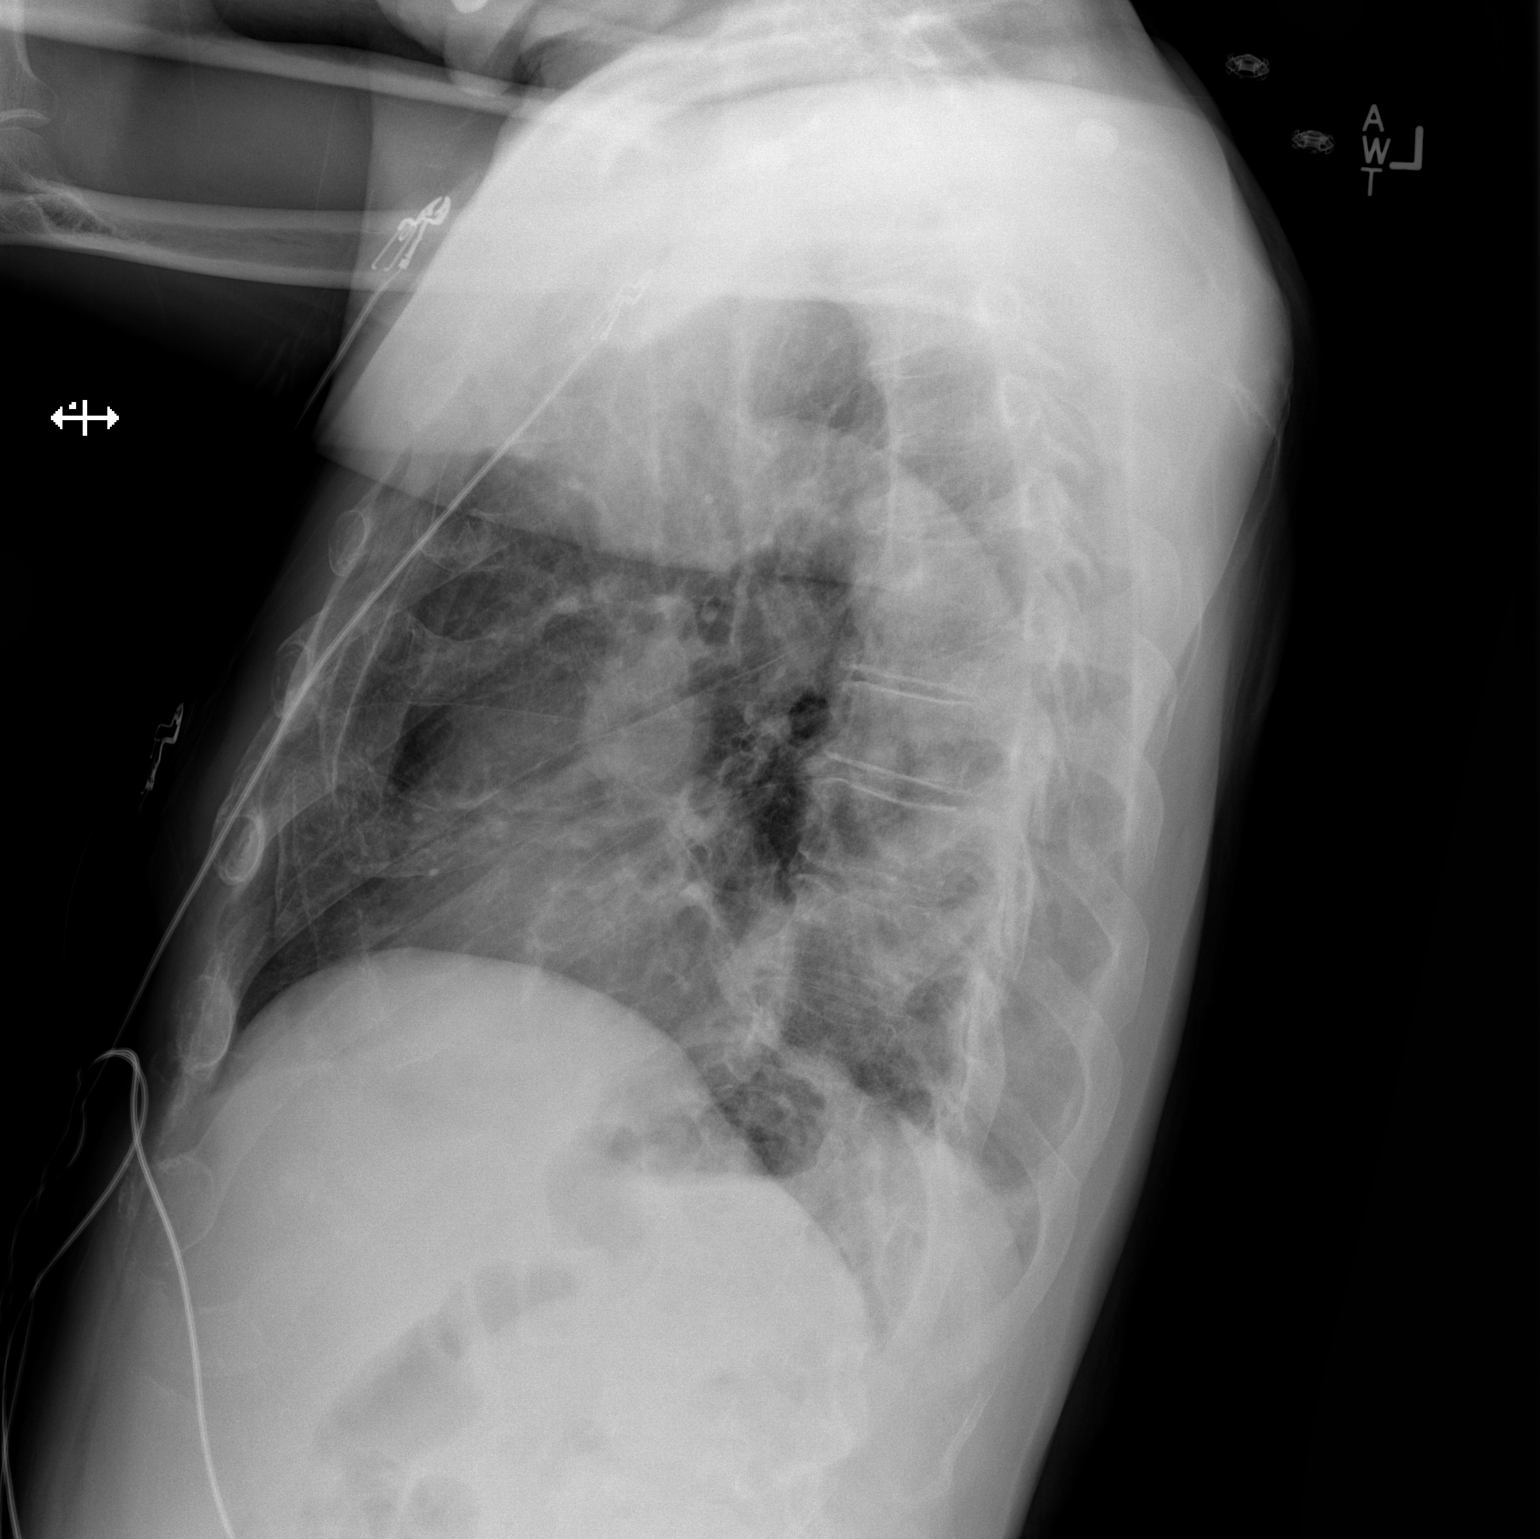

[2 of 2 positions shown; findings below may reference images not displayed]

FINDINGS: The lungs are clear. Heart size is normal. There is no pneumothorax
or effusion. No focal bony abnormality is identified.
IMPRESSION: No acute disease.
